# Patient Record
Sex: Female | Born: 1995 | Race: White | Hispanic: Yes | Smoking: Never smoker
Health system: Southern US, Community
[De-identification: ages and names within clinical notes are randomized; demographics above are authoritative.]

## PROBLEM LIST (undated history)

## (undated) ENCOUNTER — Inpatient Hospital Stay (HOSPITAL_COMMUNITY): Payer: Self-pay

## (undated) DIAGNOSIS — R519 Headache, unspecified: Secondary | ICD-10-CM

## (undated) DIAGNOSIS — N200 Calculus of kidney: Secondary | ICD-10-CM

## (undated) DIAGNOSIS — A159 Respiratory tuberculosis unspecified: Secondary | ICD-10-CM

## (undated) HISTORY — PX: NO PAST SURGERIES: SHX2092

## (undated) HISTORY — DX: Respiratory tuberculosis unspecified: A15.9

## (undated) HISTORY — DX: Headache, unspecified: R51.9

---
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
## 2014-01-26 ENCOUNTER — Ambulatory Visit: Payer: Self-pay | Attending: Internal Medicine

## 2014-02-07 ENCOUNTER — Encounter: Payer: Self-pay | Admitting: Internal Medicine

## 2014-02-07 ENCOUNTER — Ambulatory Visit: Payer: Self-pay | Attending: Internal Medicine | Admitting: Internal Medicine

## 2014-02-07 VITALS — BP 116/77 | HR 77 | Temp 98.8°F | Resp 16 | Ht 65.0 in | Wt 136.0 lb

## 2014-02-07 DIAGNOSIS — Z0000 Encounter for general adult medical examination without abnormal findings: Secondary | ICD-10-CM

## 2014-02-07 DIAGNOSIS — Z114 Encounter for screening for human immunodeficiency virus [HIV]: Secondary | ICD-10-CM | POA: Insufficient documentation

## 2014-02-07 DIAGNOSIS — Z113 Encounter for screening for infections with a predominantly sexual mode of transmission: Secondary | ICD-10-CM

## 2014-02-07 DIAGNOSIS — Z23 Encounter for immunization: Secondary | ICD-10-CM

## 2014-02-07 LAB — LIPID PANEL
Cholesterol: 174 mg/dL (ref 0–200)
HDL: 60 mg/dL
LDL Cholesterol: 92 mg/dL (ref 0–99)
Total CHOL/HDL Ratio: 2.9 ratio
Triglycerides: 109 mg/dL
VLDL: 22 mg/dL (ref 0–40)

## 2014-02-07 LAB — CERVICOVAGINAL ANCILLARY ONLY
Wet Prep (BD Affirm): NEGATIVE
Wet Prep (BD Affirm): POSITIVE — AB
Wet Prep (BD Affirm): POSITIVE — AB

## 2014-02-07 NOTE — Progress Notes (Signed)
Patient ID: Morgan Hartman, female   DOB: 1995-08-29, 18 y.o.   MRN: [id number]  NFA:[id number]  ION:[id number]  DOB - 1995-08-29  CC:  Chief Complaint  Patient presents with  . Establish Care       HPI: Morgan Hartman is a 18 y.o. female here today to establish medical care.  Patient has no past medical history and would like a general check up.  She is currently sexually active and is unsure if she should get STD testing.  She has never use tobacco, alcohol, or illicit drugs.  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 She has no c/o today.    Patient has No headache, No chest pain, No abdominal pain - No Nausea, No new weakness tingling or numbness, No Cough - SOB.  No Known Allergies History reviewed. No pertinent past medical history. No current outpatient prescriptions on file prior to visit.   No current facility-administered medications on file prior to visit.   Family History  Problem Relation Age of Onset  . Diabetes Maternal Grandmother   . Depression Maternal Grandfather   . Diabetes Paternal Grandmother    History   Social History  . Marital Status: Single    Spouse Name: N/A    Number of Children: N/A  . Years of Education: N/A   Occupational History  . Not on file.   Social History Main Topics  . Smoking status: Never Smoker   . Smokeless tobacco: Not on file  . Alcohol Use: No  . Drug Use: No  . Sexual Activity: Yes   Other Topics Concern  . Not on file   Social History Narrative  . No narrative on file    Review of Systems: Constitutional: Negative for fever, chills, diaphoresis, activity change, appetite change and fatigue. HENT: Negative for ear pain, nosebleeds, congestion, facial swelling, rhinorrhea, neck pain, neck stiffness and ear discharge.  Eyes: Negative for pain, discharge, redness, itching and visual disturbance. Respiratory: Negative for cough, choking, chest tightness, shortness of breath, wheezing and  stridor.  Cardiovascular: Negative for chest pain, palpitations and leg swelling. Gastrointestinal: Negative for abdominal distention. Genitourinary: Negative for dysuria, urgency, frequency, hematuria, flank pain, decreased urine volume, difficulty urinating and dyspareunia.  Musculoskeletal: Negative for back pain, joint swelling, arthralgia and gait problem. Neurological: Negative for dizziness, tremors, seizures, syncope, facial asymmetry, speech difficulty, weakness, light-headedness, numbness and headaches.  Hematological: Negative for adenopathy. Does not bruise/bleed easily. Psychiatric/Behavioral: Negative for hallucinations, behavioral problems, confusion, dysphoric mood, decreased concentration and agitation.    Objective:   Filed Vitals:   02/07/14 0913  BP: 116/77  Pulse: 77  Temp: 98.8 F (37.1 C)  Resp: 16    Physical Exam: Constitutional: Patient appears well-developed and well-nourished. No distress. HENT: Normocephalic, atraumatic, External right and left ear normal. Oropharynx is clear and moist.  Eyes: Conjunctivae and EOM are normal. PERRLA, no scleral icterus. Neck: Normal ROM. Neck supple. No JVD. No tracheal deviation. No thyromegaly. CVS: RRR, S1/S2 +, no murmurs, no gallops, no carotid bruit.  Pulmonary: Effort and breath sounds normal, no stridor, rhonchi, wheezes, rales.  Abdominal: Soft. BS +, no distension, tenderness, rebound or guarding.  Musculoskeletal: Normal range of motion. No edema and no tenderness.  Lymphadenopathy: No lymphadenopathy noted, cervical Neuro: Alert. Normal reflexes, muscle tone coordination. No cranial nerve deficit. Skin: Skin is warm and dry. No rash noted. Not diaphoretic. No erythema. No pallor. Psychiatric: Normal mood and affect. Behavior, judgment, thought content normal.  No results found  for this basename: WBC, HGB, HCT, MCV, PLT   No results found for this basename: CREATININE, BUN, NA, K, CL, CO2    No results  found for this basename: HGBA1C   Lipid Panel  No results found for this basename: chol, trig, hdl, cholhdl, vldl, ldlcalc       Assessment and plan:   Morgan Ruerlene was seen today for establish care.  Diagnoses and associated orders for this visit:  Annual physical exam - Cervicovaginal ancillary only - GC/chlamydia probe amp, urine - Comprehensive metabolic panel; Standing - Lipid panel - Vitamin D, 25-hydroxy - HIV antibody (with reflex) - RPR - CBC - Comprehensive metabolic panel - TSH  Screening for STD (sexually transmitted disease) Will call with results  Need for influenza vaccination Received     Return if symptoms worsen or fail to improve.      Holland CommonsKECK, Dehaven Sine, NP-C [facility] [phone] 02/07/2014, 9:25 AM

## 2014-02-07 NOTE — Patient Instructions (Signed)

## 2014-02-07 NOTE — Progress Notes (Signed)
Pt is here to establish care. Pt is here for a physical.

## 2014-02-08 LAB — GC/CHLAMYDIA PROBE AMP, URINE
Chlamydia, Swab/Urine, PCR: NEGATIVE
GC Probe Amp, Urine: NEGATIVE

## 2014-02-08 LAB — RPR

## 2014-02-08 LAB — HIV ANTIBODY (ROUTINE TESTING W REFLEX): HIV 1&2 Ab, 4th Generation: NONREACTIVE

## 2014-02-08 LAB — VITAMIN D 25 HYDROXY (VIT D DEFICIENCY, FRACTURES): Vit D, 25-Hydroxy: 26 ng/mL — ABNORMAL LOW (ref 30–89)

## 2014-02-12 ENCOUNTER — Telehealth: Payer: Self-pay | Admitting: *Deleted

## 2014-02-12 MED ORDER — FLUCONAZOLE 150 MG PO TABS: 150.0000 mg | ORAL_TABLET | Freq: Once | ORAL | Status: DC

## 2014-02-12 MED ORDER — METRONIDAZOLE 500 MG PO TABS: 500.0000 mg | ORAL_TABLET | Freq: Two times a day (BID) | ORAL | Status: DC

## 2014-02-12 NOTE — Telephone Encounter (Signed)
Pt aware of lab results. Rx Diflucan and Metronidazole was e-scribe to our pharmacy.

## 2014-02-12 NOTE — Telephone Encounter (Signed)
Message copied by Dyann KiefGIRALDEZ, Camani Sesay M on Mon Feb 12, 2014 12:32 PM ------      Message from: Ambrose FinlandKECK, VALERIE A      Created: Sun Feb 11, 2014  5:39 PM       Patient positive for yeast and BV. Please explain that these are not STD's. Send prescription for diflucan 150 mg PO once and metronidazole 500 mg BID for 7 days.  All STD's are negative.  She will need OTC vitamin d 1,000 IU to supplement low levels. Thanks ------

## 2015-04-28 DIAGNOSIS — N200 Calculus of kidney: Secondary | ICD-10-CM

## 2015-04-28 HISTORY — DX: Calculus of kidney: N20.0

## 2015-10-23 ENCOUNTER — Ambulatory Visit (HOSPITAL_COMMUNITY)
Admission: RE | Admit: 2015-10-23 | Discharge: 2015-10-23 | Disposition: A | Discharge status: Home / Self Care | Payer: Self-pay | Source: Ambulatory Visit | Attending: Preventative Medicine | Admitting: Preventative Medicine

## 2015-10-23 ENCOUNTER — Other Ambulatory Visit (HOSPITAL_COMMUNITY): Payer: Self-pay | Admitting: Preventative Medicine

## 2015-10-23 DIAGNOSIS — R1031 Right lower quadrant pain: Secondary | ICD-10-CM | POA: Insufficient documentation

## 2015-10-23 DIAGNOSIS — N132 Hydronephrosis with renal and ureteral calculous obstruction: Secondary | ICD-10-CM | POA: Insufficient documentation

## 2016-01-07 ENCOUNTER — Emergency Department (HOSPITAL_COMMUNITY): Payer: Medicaid Other

## 2016-01-07 ENCOUNTER — Encounter: Payer: Self-pay | Admitting: Internal Medicine

## 2016-01-07 ENCOUNTER — Emergency Department (HOSPITAL_COMMUNITY)
Admission: EM | Admit: 2016-01-07 | Discharge: 2016-01-07 | Disposition: A | Discharge status: Home / Self Care | Payer: Medicaid Other | Attending: Emergency Medicine | Admitting: Emergency Medicine

## 2016-01-07 ENCOUNTER — Encounter (HOSPITAL_COMMUNITY): Payer: Self-pay | Admitting: Emergency Medicine

## 2016-01-07 DIAGNOSIS — O209 Hemorrhage in early pregnancy, unspecified: Secondary | ICD-10-CM | POA: Diagnosis not present

## 2016-01-07 DIAGNOSIS — Z3A08 8 weeks gestation of pregnancy: Secondary | ICD-10-CM | POA: Insufficient documentation

## 2016-01-07 DIAGNOSIS — Z79899 Other long term (current) drug therapy: Secondary | ICD-10-CM | POA: Diagnosis not present

## 2016-01-07 HISTORY — DX: Calculus of kidney: N20.0

## 2016-01-07 LAB — TYPE AND SCREEN
ABO/RH(D): O POS
ANTIBODY SCREEN: NEGATIVE

## 2016-01-07 LAB — BASIC METABOLIC PANEL
ANION GAP: 6 (ref 5–15)
BUN: 10 mg/dL (ref 6–20)
CO2: 24 mmol/L (ref 22–32)
Calcium: 9.2 mg/dL (ref 8.9–10.3)
Chloride: 107 mmol/L (ref 101–111)
Creatinine, Ser: 0.45 mg/dL (ref 0.44–1.00)
GLUCOSE: 96 mg/dL (ref 65–99)
POTASSIUM: 3.7 mmol/L (ref 3.5–5.1)
SODIUM: 137 mmol/L (ref 135–145)

## 2016-01-07 LAB — HCG, QUANTITATIVE, PREGNANCY: HCG, BETA CHAIN, QUANT, S: 1162 m[IU]/mL — AB (ref <5)

## 2016-01-07 LAB — WET PREP, GENITAL
Clue Cells Wet Prep HPF POC: NONE SEEN
SPERM: NONE SEEN
Trich, Wet Prep: NONE SEEN
Yeast Wet Prep HPF POC: NONE SEEN

## 2016-01-07 LAB — CBC
HCT: 35.9 % — ABNORMAL LOW (ref 36.0–46.0)
Hemoglobin: 11.9 g/dL — ABNORMAL LOW (ref 12.0–15.0)
MCH: 29.2 pg (ref 26.0–34.0)
MCHC: 33.1 g/dL (ref 30.0–36.0)
MCV: 88.2 fL (ref 78.0–100.0)
PLATELETS: 271 10*3/uL (ref 150–400)
RBC: 4.07 MIL/uL (ref 3.87–5.11)
RDW: 13.5 % (ref 11.5–15.5)
WBC: 5.5 10*3/uL (ref 4.0–10.5)

## 2016-01-07 LAB — I-STAT BETA HCG BLOOD, ED (MC, WL, AP ONLY): I-stat hCG, quantitative: 1057.8 m[IU]/mL — ABNORMAL HIGH (ref <5)

## 2016-01-07 LAB — ABO/RH: ABO/RH(D): O POS

## 2016-01-07 NOTE — ED Notes (Signed)
Patient transported to Ultrasound 

## 2016-01-07 NOTE — ED Notes (Signed)
Patient report bleeding and she is 8 weeks preg

## 2016-01-07 NOTE — ED Provider Notes (Signed)
[facility]-EMERGENCY DEPT Provider Note   CSN: [id number] Arrival date & time: 01/07/16  0836     History   Chief Complaint Chief Complaint  Patient presents with  . Vaginal Bleeding    HPI Morgan Hartman is a 20 y.o. female presenting with vaginal bleeding. She is approximately [redacted] weeks pregnant. This is her first pregnancy. Patient states that her last ventral. Was 6/18 and she [facility]. Has not had any ultrasounds. 5 days ago developed spotting and then 2 days ago developed heavier bleeding with clots. Not as heavy as her typical periods with her very heavy. Has had some lower abdominal cramping. Does not know her blood type. No dizziness or lightheadedness. No urinary symptoms or vaginal discharge.  HPI  Past Medical History:  Diagnosis Date  . Kidney calculus 2017    There are no active problems to display for this patient.   History reviewed. No pertinent surgical history.  OB History    Gravida Para Term Preterm AB Living   1 0 0 0 0     SAB TAB Ectopic Multiple Live Births   0 0 0           Home Medications    Prior to Admission medications   Medication Sig Start Date End Date Taking? Authorizing Provider  Prenatal Vit-Fe Fumarate-FA (PRENATAL PLUS PO) Take 1 tablet by mouth daily.   Yes Historical Provider, MD  fluconazole (DIFLUCAN) 150 MG tablet Take 1 tablet (150 mg total) by mouth once. 02/12/14   Ambrose Finland, NP  metroNIDAZOLE (FLAGYL) 500 MG tablet Take 1 tablet (500 mg total) by mouth 2 (two) times daily. 02/12/14   Ambrose Finland, NP    Family History Family History  Problem Relation Age of Onset  . Diabetes Maternal Grandmother   . Depression Maternal Grandfather   . Diabetes Paternal Grandmother     Social History Social History  Substance Use Topics  . Smoking status: Never Smoker  . Smokeless tobacco: Never Used  . Alcohol use No     Allergies   Review of patient's allergies indicates no known  allergies.   Review of Systems Review of Systems  Gastrointestinal: Positive for abdominal pain. Negative for vomiting.  Genitourinary: Positive for vaginal bleeding.  Musculoskeletal: Negative for back pain.  Neurological: Negative for dizziness and light-headedness.  All other systems reviewed and are negative.    Physical Exam Updated Vital Signs BP 126/64 (BP Location: Right Arm)   Pulse 93   Temp 98.5 F (36.9 C)   Resp 16   Ht 5\' 5"  (1.651 m)   Wt 141 lb (64 kg)   LMP 10/15/2015 (Exact Date) Comment: 8 wks today  SpO2 100%   BMI 23.46 kg/m   Physical Exam  Constitutional: She is oriented to person, place, and time. She appears well-developed and well-nourished. No distress.  HENT:  Head: Normocephalic and atraumatic.  Right Ear: External ear normal.  Left Ear: External ear normal.  Nose: Nose normal.  Eyes: Right eye exhibits no discharge. Left eye exhibits no discharge.  Cardiovascular: Normal rate, regular rhythm and normal heart sounds.   Pulmonary/Chest: Effort normal and breath sounds normal.  Abdominal: Soft. There is tenderness in the suprapubic area.    Genitourinary: There is bleeding in the vagina.  Genitourinary Comments: Moderate blood in vagina. Difficult to get a good view of cervix, unclear if open or not. No heavy bleeding or obvious clots/tissue  Neurological: She is  alert and oriented to person, place, and time.  Skin: Skin is warm and dry. She is not diaphoretic.  Nursing note and vitals reviewed.    ED Treatments / Results  Labs (all labs ordered are listed, but only abnormal results are displayed) Labs Reviewed  WET PREP, GENITAL - Abnormal; Notable for the following:       Result Value   WBC, Wet Prep HPF POC FEW (*)    All other components within normal limits  CBC - Abnormal; Notable for the following:    Hemoglobin 11.9 (*)    HCT 35.9 (*)    All other components within normal limits  HCG, QUANTITATIVE, PREGNANCY - Abnormal;  Notable for the following:    hCG, Beta Chain, Quant, S 1,162 (*)    All other components within normal limits  I-STAT BETA HCG BLOOD, ED ([facility], [facility], [facility] ONLY) - Abnormal; Notable for the following:    I-stat hCG, quantitative 1,057.8 (*)    All other components within normal limits  BASIC METABOLIC PANEL  ABO/RH  TYPE AND SCREEN  GC/CHLAMYDIA PROBE AMP ([place]) NOT AT [facility]    EKG  EKG Interpretation None       Radiology US Ob Comp Less 14 Wks  Result Date: 01/07/2016 CLINICAL DATA:  Heavy vaginal bleeding for 2 days. EXAM: OBSTETRIC <14 WK [place] AND TRANSVAGINAL OB US TECHNIQUE: Both transabdominal and transvaginal ultrasound examinations were performed for complete evaluation of the gestation as well as the maternal uterus, adnexal regions, and pelvic cul-de-sac. Transvaginal technique was performed to assess early pregnancy. COMPARISON:  None. FINDINGS: Intrauterine gestational sac: Not visualized Yolk sac:  Not visualized Embryo:  Not visualized Cardiac Activity: Heart Rate:   bpm MSD:   mm    w     d CRL:    mm    w    d                  [place] EDC: Subchorionic hemorrhage:  None visualized. Maternal uterus/adnexae: 1.6 cm corpus luteum cyst on the left. Small amount of free fluid in the pelvis. IMPRESSION: No intrauterine pregnancy visualized. Differential considerations would include early intrauterine pregnancy too early to visualize, spontaneous abortion, or occult ectopic pregnancy. Recommend close clinical followup and serial quantitative beta HCGs and ultrasounds. Electronically Signed   By: Charlett Nose M.D.   On: 01/07/2016 10:53   US Ob Transvaginal  Result Date: 01/07/2016 CLINICAL DATA:  Heavy vaginal bleeding for 2 days. EXAM: OBSTETRIC <14 WK [place] AND TRANSVAGINAL OB US TECHNIQUE: Both transabdominal and transvaginal ultrasound examinations were performed for complete evaluation of the gestation as well as the maternal uterus, adnexal regions, and pelvic cul-de-sac. Transvaginal  technique was performed to assess early pregnancy. COMPARISON:  None. FINDINGS: Intrauterine gestational sac: Not visualized Yolk sac:  Not visualized Embryo:  Not visualized Cardiac Activity: Heart Rate:   bpm MSD:   mm    w     d CRL:    mm    w    d                  [place] EDC: Subchorionic hemorrhage:  None visualized. Maternal uterus/adnexae: 1.6 cm corpus luteum cyst on the left. Small amount of free fluid in the pelvis. IMPRESSION: No intrauterine pregnancy visualized. Differential considerations would include early intrauterine pregnancy too early to visualize, spontaneous abortion, or occult ectopic pregnancy. Recommend close clinical followup and serial quantitative beta HCGs and ultrasounds. Electronically  Signed   By: Charlett NoseKevin  Dover M.D.   On: 01/07/2016 10:53    Procedures Procedures (including critical care time)  Medications Ordered in ED Medications - No data to display   Initial Impression / Assessment and Plan / ED Course  I have reviewed the triage vital signs and the nursing notes.  Pertinent labs & imaging results that were available during my care of the patient were reviewed by me and considered in my medical decision making (see chart for details).  Clinical Course  Comment By Time  Plan for pelvic ultrasound, pelvic exam, labs including RH. Hemodynamically stable. Pricilla LovelessScott Deneise Getty, MD 09/12 [phone]  U/s with no obvious pregnancy. Remains stable. Unclear if this is early pregnancy, miscarriage or early ectopic. Will have her f/u with OB in 2 days for repeat HCG and u/s. Discussed strict return precautions. Pricilla LovelessScott Whitt Auletta, MD 09/12 1130    Final Clinical Impressions(s) / ED Diagnoses   Final diagnoses:  Vaginal bleeding in pregnancy, first trimester    New Prescriptions Discharge Medication List as of 01/07/2016 11:29 AM       Pricilla LovelessScott Zykeem Bauserman, MD 01/07/16 [phone]

## 2016-01-07 NOTE — Discharge Instructions (Signed)
[facility] very [facility] test of your pregnancy as well as a repeat ultrasound. If you develop any worsening pain or vaginal bleeding, return here or any other hospital for evaluation.

## 2016-01-07 NOTE — ED Triage Notes (Signed)
Pt reports spotting beginning 9/6 and increased bleeding yesterday and this morning.  Reports cramping and bright red blood.  Denies vomiting.  Reports nausea.

## 2016-01-08 LAB — GC/CHLAMYDIA PROBE AMP (~~LOC~~) NOT AT ARMC
Chlamydia: NEGATIVE
Neisseria Gonorrhea: NEGATIVE

## 2016-01-09 ENCOUNTER — Other Ambulatory Visit: Payer: Self-pay

## 2016-01-09 DIAGNOSIS — Z3490 Encounter for supervision of normal pregnancy, unspecified, unspecified trimester: Secondary | ICD-10-CM

## 2016-01-10 LAB — HCG, QUANTITATIVE, PREGNANCY: hCG, Beta Chain, Quant, S: 266.4 m[IU]/mL — ABNORMAL HIGH

## 2016-01-13 ENCOUNTER — Telehealth: Payer: Self-pay | Admitting: *Deleted

## 2016-01-13 NOTE — Telephone Encounter (Signed)
Pt left message on 9/14 @ 1114 requesting test results from the previous day.

## 2016-01-14 NOTE — Telephone Encounter (Signed)
I attempted to call patientno answer left message on voicemail.  I don't know if anyone discussed this pt w/ another provider, but it came to my box. Drop in quant C/W SAB. Needs weekly quants until <1 due to IUP not confirmed.

## 2016-01-22 NOTE — Telephone Encounter (Signed)
Letter has been sent to the patient regarding needing another appointment.

## 2016-01-23 ENCOUNTER — Other Ambulatory Visit: Payer: Self-pay

## 2016-01-23 DIAGNOSIS — O3680X0 Pregnancy with inconclusive fetal viability, not applicable or unspecified: Secondary | ICD-10-CM

## 2016-01-24 LAB — HCG, QUANTITATIVE, PREGNANCY: hCG, Beta Chain, Quant, S: <2 m[IU]/mL

## 2016-04-27 NOTE — L&D Delivery Note (Signed)
21 y.o. G2P0010 at 3452w4d delivered a viable female infant in cephalic, ROA position. Anterior shoulder delivered with ease. 60 sec delayed cord clamping. Cord clamped x2 and cut. Placenta delivered spontaneously intact, with 3VC. Fundus firm on exam with massage and pitocin. Good hemostasis noted.  Anesthesia: None Laceration: minor right labial abrasion approximates well, no sutures required  Good hemostasis noted. EBL: 150 cc  Mom and baby recovering in LDR.    Apgars: APGAR (1 MIN): 8   APGAR (5 MINS): 9    Weight: Pending skin to skin  Sponge and instrument count were correct x2. Placenta sent to L&D.  Howard PouchLauren Feng, MD PGY-2 Family Medicine 10/29/2016, 6:19 AM  Patient is a G2P0010 at 552w4d who was admitted with SROM/SOL, essentially uncomplicated prenatal course.  She progressed without augmentation to SVD.  I was gloved and present for delivery in its entirety.  Second stage of labor progressed, baby delivered after pushing approx 1 hour.  Mild decels during second stage noted.  Complications: none  Lacerations: none  EBL: 150cc  Cam HaiSHAW, Lakeisha Waldrop, CNM 9:43 AM 10/29/2016

## 2016-04-29 ENCOUNTER — Encounter: Payer: Self-pay | Admitting: Obstetrics and Gynecology

## 2016-04-29 ENCOUNTER — Ambulatory Visit (INDEPENDENT_AMBULATORY_CARE_PROVIDER_SITE_OTHER): Payer: Medicaid Other | Admitting: Obstetrics and Gynecology

## 2016-04-29 ENCOUNTER — Other Ambulatory Visit (HOSPITAL_COMMUNITY)
Admission: RE | Admit: 2016-04-29 | Discharge: 2016-04-29 | Disposition: A | Discharge status: Home / Self Care | Payer: Medicaid Other | Source: Ambulatory Visit | Attending: Obstetrics and Gynecology | Admitting: Obstetrics and Gynecology

## 2016-04-29 DIAGNOSIS — Z3401 Encounter for supervision of normal first pregnancy, first trimester: Secondary | ICD-10-CM

## 2016-04-29 DIAGNOSIS — Z23 Encounter for immunization: Secondary | ICD-10-CM

## 2016-04-29 DIAGNOSIS — Z01419 Encounter for gynecological examination (general) (routine) without abnormal findings: Secondary | ICD-10-CM | POA: Insufficient documentation

## 2016-04-29 DIAGNOSIS — Z113 Encounter for screening for infections with a predominantly sexual mode of transmission: Secondary | ICD-10-CM | POA: Diagnosis present

## 2016-04-29 DIAGNOSIS — Z3400 Encounter for supervision of normal first pregnancy, unspecified trimester: Secondary | ICD-10-CM | POA: Insufficient documentation

## 2016-04-29 NOTE — Progress Notes (Signed)
  Subjective:    Morgan Hartman is a G2P0010 6183w3d being seen today for her first obstetrical visit.  Her obstetrical history is significant for first normal pregnancy. Patient does intend to breast feed. Pregnancy history fully reviewed.  Patient reports no complaints.  There were no vitals filed for this visit.  HISTORY: OB History  Gravida Para Term Preterm AB Living  2 0 0 0 1    SAB TAB Ectopic Multiple Live Births  1 0 0        # Outcome Date GA Lbr Len/2nd Weight Sex Delivery Anes PTL Lv  2 Current           1 SAB 12/2015 6043w0d            Past Medical History:  Diagnosis Date  . Kidney calculus 2017   No past surgical history on file. Family History  Problem Relation Age of Onset  . Diabetes Maternal Grandmother   . Depression Maternal Grandfather   . Diabetes Paternal Grandmother      Exam    Uterus:   12-weeks in size  Pelvic Exam:    Perineum: No Hemorrhoids, Normal Perineum   Vulva: normal   Vagina:  normal mucosa, normal discharge   pH:    Cervix: nulliparous appearance   Adnexa: normal adnexa and no mass, fullness, tenderness   Bony Pelvis: gynecoid  System: Breast:  normal appearance, no masses or tenderness   Skin: normal coloration and turgor, no rashes    Neurologic: oriented, no focal deficits   Extremities: normal strength, tone, and muscle mass   HEENT extra ocular movement intact   Mouth/Teeth mucous membranes moist, pharynx normal without lesions and dental hygiene good   Neck supple and no masses   Cardiovascular: regular rate and rhythm   Respiratory:  chest clear, no wheezing, crepitations, rhonchi, normal symmetric air entry   Abdomen: soft, non-tender; bowel sounds normal; no masses,  no organomegaly   Urinary:       Assessment:    Pregnancy: G2P0010 There are no active problems to display for this patient.       Plan:     Initial labs drawn. Prenatal vitamins. Problem list reviewed and updated. Genetic Screening  discussed Quad Screen: requested.  Ultrasound discussed; fetal survey: requested. Patient desires flu vaccine  Follow up in 4 weeks. 50% of 30 min visit spent on counseling and coordination of care.     Annmarie Plemmons 04/29/2016

## 2016-04-29 NOTE — Patient Instructions (Signed)
Second Trimester of Pregnancy The second trimester is from week 13 through week 28 (months 4 through 6). The second trimester is often a time when you feel your best. Your body has also adjusted to being pregnant, and you begin to feel better physically. Usually, morning sickness has lessened or quit completely, you may have more energy, and you may have an increase in appetite. The second trimester is also a time when the fetus is growing rapidly. At the end of the sixth month, the fetus is about 9 inches long and weighs about 1 pounds. You will likely begin to feel the baby move (quickening) between 18 and 20 weeks of the pregnancy. Body changes during your second trimester Your body continues to go through many changes during your second trimester. The changes vary from woman to woman.  Your weight will continue to increase. You will notice your lower abdomen bulging out.  You may begin to get stretch marks on your hips, abdomen, and breasts.  You may develop headaches that can be relieved by medicines. The medicines should be approved by your health care provider.  You may urinate more often because the fetus is pressing on your bladder.  You may develop or continue to have heartburn as a result of your pregnancy.  You may develop constipation because certain hormones are causing the muscles that push waste through your intestines to slow down.  You may develop hemorrhoids or swollen, bulging veins (varicose veins).  You may have back pain. This is caused by:  Weight gain.  Pregnancy hormones that are relaxing the joints in your pelvis.  A shift in weight and the muscles that support your balance.  Your breasts will continue to grow and they will continue to become tender.  Your gums may bleed and may be sensitive to brushing and flossing.  Dark spots or blotches (chloasma, mask of pregnancy) may develop on your face. This will likely fade after the baby is born.  A dark line  from your belly button to the pubic area (linea nigra) may appear. This will likely fade after the baby is born.  You may have changes in your hair. These can include thickening of your hair, rapid growth, and changes in texture. Some women also have hair loss during or after pregnancy, or hair that feels dry or thin. Your hair will most likely return to normal after your baby is born. What to expect at prenatal visits During a routine prenatal visit:  You will be weighed to make sure you and the fetus are growing normally.  Your blood pressure will be taken.  Your abdomen will be measured to track your baby's growth.  The fetal heartbeat will be listened to.  Any test results from the previous visit will be discussed. Your health care provider may ask you:  How you are feeling.  If you are feeling the baby move.  If you have had any abnormal symptoms, such as leaking fluid, bleeding, severe headaches, or abdominal cramping.  If you are using any tobacco products, including cigarettes, chewing tobacco, and electronic cigarettes.  If you have any questions. Other tests that may be performed during your second trimester include:  Blood tests that check for:  Low iron levels (anemia).  Gestational diabetes (between 24 and 28 weeks).  Rh antibodies. This is to check for a protein on red blood cells (Rh factor).  Urine tests to check for infections, diabetes, or protein in the urine.  An ultrasound to   confirm the proper growth and development of the baby.  An amniocentesis to check for possible genetic problems.  Fetal screens for spina bifida and Down syndrome.  HIV (human immunodeficiency virus) testing. Routine prenatal testing includes screening for HIV, unless you choose not to have this test. Follow these instructions at home: Eating and drinking  Continue to eat regular, healthy meals.  Avoid raw meat, uncooked cheese, cat litter boxes, and soil used by cats. These  carry germs that can cause birth defects in the baby.  Take your prenatal vitamins.  Take 1500-2000 mg of calcium daily starting at the 20th week of pregnancy until you deliver your baby.  If you develop constipation:  Take over-the-counter or prescription medicines.  Drink enough fluid to keep your urine clear or pale yellow.  Eat foods that are high in fiber, such as fresh fruits and vegetables, whole grains, and beans.  Limit foods that are high in fat and processed sugars, such as fried and sweet foods. Activity  Exercise only as directed by your health care provider. Experiencing uterine cramps is a good sign to stop exercising.  Avoid heavy lifting, wear low heel shoes, and practice good posture.  Wear your seat belt at all times when driving.  Rest with your legs elevated if you have leg cramps or low back pain.  Wear a good support bra for breast tenderness.  Do not use hot tubs, steam rooms, or saunas. Lifestyle  Avoid all smoking, herbs, alcohol, and unprescribed drugs. These chemicals affect the formation and growth of the baby.  Do not use any products that contain nicotine or tobacco, such as cigarettes and e-cigarettes. If you need help quitting, ask your health care provider.  A sexual relationship may be continued unless your health care provider directs you otherwise. General instructions  Follow your health care provider's instructions regarding medicine use. There are medicines that are either safe or unsafe to take during pregnancy.  Take warm sitz baths to soothe any pain or discomfort caused by hemorrhoids. Use hemorrhoid cream if your health care provider approves.  If you develop varicose veins, wear support hose. Elevate your feet for 15 minutes, 3-4 times a day. Limit salt in your diet.  Visit your dentist if you have not gone yet during your pregnancy. Use a soft toothbrush to brush your teeth and be gentle when you floss.  Keep all follow-up  prenatal visits as told by your health care provider. This is important. Contact a health care provider if:  You have dizziness.  You have mild pelvic cramps, pelvic pressure, or nagging pain in the abdominal area.  You have persistent nausea, vomiting, or diarrhea.  You have a bad smelling vaginal discharge.  You have pain with urination. Get help right away if:  You have a fever.  You are leaking fluid from your vagina.  You have spotting or bleeding from your vagina.  You have severe abdominal cramping or pain.  You have rapid weight gain or weight loss.  You have shortness of breath with chest pain.  You notice sudden or extreme swelling of your face, hands, ankles, feet, or legs.  You have not felt your baby move in over an hour.  You have severe headaches that do not go away with medicine.  You have vision changes. Summary  The second trimester is from week 13 through week 28 (months 4 through 6). It is also a time when the fetus is growing rapidly.  Your body goes   through many changes during pregnancy. The changes vary from woman to woman.  Avoid all smoking, herbs, alcohol, and unprescribed drugs. These chemicals affect the formation and growth your baby.  Do not use any tobacco products, such as cigarettes, chewing tobacco, and e-cigarettes. If you need help quitting, ask your health care provider.  Contact your health care provider if you have any questions. Keep all prenatal visits as told by your health care provider. This is important. This information is not intended to replace advice given to you by your health care provider. Make sure you discuss any questions you have with your health care provider. Document Released: 04/07/2001 Document Revised: 09/19/2015 Document Reviewed: 06/14/2012 Elsevier Interactive Patient Education  2017 [employer]  Contraception Choices Contraception (birth control) is the use of any methods or devices to prevent  pregnancy. Below are some methods to help avoid pregnancy. Hormonal methods  Contraceptive implant. This is a thin, plastic tube containing progesterone hormone. It does not contain estrogen hormone. Your health care provider inserts the tube in the inner part of the upper arm. The tube can remain in place for up to 3 years. After 3 years, the implant must be removed. The implant prevents the ovaries from releasing an egg (ovulation), thickens the cervical mucus to prevent sperm from entering the uterus, and thins the lining of the inside of the uterus.  Progesterone-only injections. These injections are given every 3 months by your health care provider to prevent pregnancy. This synthetic progesterone hormone stops the ovaries from releasing eggs. It also thickens cervical mucus and changes the uterine lining. This makes it harder for sperm to survive in the uterus.  Birth control pills. These pills contain estrogen and progesterone hormone. They work by preventing the ovaries from releasing eggs (ovulation). They also cause the cervical mucus to thicken, preventing the sperm from entering the uterus. Birth control pills are prescribed by a health care provider.Birth control pills can also be used to treat heavy periods.  Minipill. This type of birth control pill contains only the progesterone hormone. They are taken every day of each month and must be prescribed by your health care provider.  Birth control patch. The patch contains hormones similar to those in birth control pills. It must be changed once a week and is prescribed by a health care provider.  Vaginal ring. The ring contains hormones similar to those in birth control pills. It is left in the vagina for 3 weeks, removed for 1 week, and then a new one is put back in place. The patient must be comfortable inserting and removing the ring from the vagina.A health care provider's prescription is necessary.  Emergency contraception.  Emergency contraceptives prevent pregnancy after unprotected sexual intercourse. This pill can be taken right after sex or up to 5 days after unprotected sex. It is most effective the sooner you take the pills after having sexual intercourse. Most emergency contraceptive pills are available without a prescription. Check with your pharmacist. Do not use emergency contraception as your only form of birth control. Barrier methods  Female condom. This is a thin sheath (latex or rubber) that is worn over the penis during sexual intercourse. It can be used with spermicide to increase effectiveness.  Female condom. This is a soft, loose-fitting sheath that is put into the vagina before sexual intercourse.  Diaphragm. This is a soft, latex, dome-shaped barrier that must be fitted by a health care provider. It is inserted into the vagina, along with a   spermicidal jelly. It is inserted before intercourse. The diaphragm should be left in the vagina for 6 to 8 hours after intercourse.  Cervical cap. This is a round, soft, latex or plastic cup that fits over the cervix and must be fitted by a health care provider. The cap can be left in place for up to 48 hours after intercourse.  Sponge. This is a soft, circular piece of polyurethane foam. The sponge has spermicide in it. It is inserted into the vagina after wetting it and before sexual intercourse.  Spermicides. These are chemicals that kill or block sperm from entering the cervix and uterus. They come in the form of creams, jellies, suppositories, foam, or tablets. They do not require a prescription. They are inserted into the vagina with an applicator before having sexual intercourse. The process must be repeated every time you have sexual intercourse. Intrauterine contraception  Intrauterine device (IUD). This is a T-shaped device that is put in a woman's uterus during a menstrual period to prevent pregnancy. There are 2 types:  Copper IUD. This type of IUD  is wrapped in copper wire and is placed inside the uterus. Copper makes the uterus and fallopian tubes produce a fluid that kills sperm. It can stay in place for 10 years.  Hormone IUD. This type of IUD contains the hormone progestin (synthetic progesterone). The hormone thickens the cervical mucus and prevents sperm from entering the uterus, and it also thins the uterine lining to prevent implantation of a fertilized egg. The hormone can weaken or kill the sperm that get into the uterus. It can stay in place for 3-5 years, depending on which type of IUD is used. Permanent methods of contraception  Female tubal ligation. This is when the woman's fallopian tubes are surgically sealed, tied, or blocked to prevent the egg from traveling to the uterus.  Hysteroscopic sterilization. This involves placing a small coil or insert into each fallopian tube. Your doctor uses a technique called hysteroscopy to do the procedure. The device causes scar tissue to form. This results in permanent blockage of the fallopian tubes, so the sperm cannot fertilize the egg. It takes about 3 months after the procedure for the tubes to become blocked. You must use another form of birth control for these 3 months.  Female sterilization. This is when the female has the tubes that carry sperm tied off (vasectomy).This blocks sperm from entering the vagina during sexual intercourse. After the procedure, the man can still ejaculate fluid (semen). Natural planning methods  Natural family planning. This is not having sexual intercourse or using a barrier method (condom, diaphragm, cervical cap) on days the woman could become pregnant.  Calendar method. This is keeping track of the length of each menstrual cycle and identifying when you are fertile.  Ovulation method. This is avoiding sexual intercourse during ovulation.  Symptothermal method. This is avoiding sexual intercourse during ovulation, using a thermometer and ovulation  symptoms.  Post-ovulation method. This is timing sexual intercourse after you have ovulated. Regardless of which type or method of contraception you choose, it is important that you use condoms to protect against the transmission of sexually transmitted infections (STIs). Talk with your health care provider about which form of contraception is most appropriate for you. This information is not intended to replace advice given to you by your health care provider. Make sure you discuss any questions you have with your health care provider. Document Released: 04/13/2005 Document Revised: 09/19/2015 Document Reviewed: 10/06/2012 Elsevier Interactive   Patient Education  2017 [employer]   Breastfeeding Deciding to breastfeed is one of the best choices you can make for you and your baby. A change in hormones during pregnancy causes your breast tissue to grow and increases the number and size of your milk ducts. These hormones also allow proteins, sugars, and fats from your blood supply to make breast milk in your milk-producing glands. Hormones prevent breast milk from being released before your baby is born as well as prompt milk flow after birth. Once breastfeeding has begun, thoughts of your baby, as well as his or her sucking or crying, can stimulate the release of milk from your milk-producing glands. Benefits of breastfeeding For Your Baby  Your first milk (colostrum) helps your baby's digestive system function better.  There are antibodies in your milk that help your baby fight off infections.  Your baby has a lower incidence of asthma, allergies, and sudden infant death syndrome.  The nutrients in breast milk are better for your baby than infant formulas and are designed uniquely for your baby's needs.  Breast milk improves your baby's brain development.  Your baby is less likely to develop other conditions, such as childhood obesity, asthma, or type 2 diabetes mellitus. For  You  Breastfeeding helps to create a very special bond between you and your baby.  Breastfeeding is convenient. Breast milk is always available at the correct temperature and costs nothing.  Breastfeeding helps to burn calories and helps you lose the weight gained during pregnancy.  Breastfeeding makes your uterus contract to its prepregnancy size faster and slows bleeding (lochia) after you give birth.  Breastfeeding helps to lower your risk of developing type 2 diabetes mellitus, osteoporosis, and breast or ovarian cancer later in life. Signs that your baby is hungry Early Signs of Hunger  Increased alertness or activity.  Stretching.  Movement of the head from side to side.  Movement of the head and opening of the mouth when the corner of the mouth or cheek is stroked (rooting).  Increased sucking sounds, smacking lips, cooing, sighing, or squeaking.  Hand-to-mouth movements.  Increased sucking of fingers or hands. Late Signs of Hunger  Fussing.  Intermittent crying. Extreme Signs of Hunger  Signs of extreme hunger will require calming and consoling before your baby will be able to breastfeed successfully. Do not wait for the following signs of extreme hunger to occur before you initiate breastfeeding:  Restlessness.  A loud, strong cry.  Screaming. Breastfeeding basics  Breastfeeding Initiation  Find a comfortable place to sit or lie down, with your neck and back well supported.  Place a pillow or rolled up blanket under your baby to bring him or her to the level of your breast (if you are seated). Nursing pillows are specially designed to help support your arms and your baby while you breastfeed.  Make sure that your baby's abdomen is facing your abdomen.  Gently massage your breast. With your fingertips, massage from your chest wall toward your nipple in a circular motion. This encourages milk flow. You may need to continue this action during the feeding if your  milk flows slowly.  Support your breast with 4 fingers underneath and your thumb above your nipple. Make sure your fingers are well away from your nipple and your baby's mouth.  Stroke your baby's lips gently with your finger or nipple.  When your baby's mouth is open wide enough, quickly bring your baby to your breast, placing your entire nipple and   as much of the colored area around your nipple (areola) as possible into your baby's mouth.  More areola should be visible above your baby's upper lip than below the lower lip.  Your baby's tongue should be between his or her lower gum and your breast.  Ensure that your baby's mouth is correctly positioned around your nipple (latched). Your baby's lips should create a seal on your breast and be turned out (everted).  It is common for your baby to suck about 2-3 minutes in order to start the flow of breast milk. Latching  Teaching your baby how to latch on to your breast properly is very important. An improper latch can cause nipple pain and decreased milk supply for you and poor weight gain in your baby. Also, if your baby is not latched onto your nipple properly, he or she may swallow some air during feeding. This can make your baby fussy. Burping your baby when you switch breasts during the feeding can help to get rid of the air. However, teaching your baby to latch on properly is still the best way to prevent fussiness from swallowing air while breastfeeding. Signs that your baby has successfully latched on to your nipple:  Silent tugging or silent sucking, without causing you pain.  Swallowing heard between every 3-4 sucks.  Muscle movement above and in front of his or her ears while sucking. Signs that your baby has not successfully latched on to nipple:  Sucking sounds or smacking sounds from your baby while breastfeeding.  Nipple pain. If you think your baby has not latched on correctly, slip your finger into the corner of your baby's  mouth to break the suction and place it between your baby's gums. Attempt breastfeeding initiation again. Signs of Successful Breastfeeding  Signs from your baby:  A gradual decrease in the number of sucks or complete cessation of sucking.  Falling asleep.  Relaxation of his or her body.  Retention of a small amount of milk in his or her mouth.  Letting go of your breast by himself or herself. Signs from you:  Breasts that have increased in firmness, weight, and size 1-3 hours after feeding.  Breasts that are softer immediately after breastfeeding.  Increased milk volume, as well as a change in milk consistency and color by the fifth day of breastfeeding.  Nipples that are not sore, cracked, or bleeding. Signs That Your Baby is Getting Enough Milk  Wetting at least 1-2 diapers during the first 24 hours after birth.  Wetting at least 5-6 diapers every 24 hours for the first week after birth. The urine should be clear or pale yellow by 5 days after birth.  Wetting 6-8 diapers every 24 hours as your baby continues to grow and develop.  At least 3 stools in a 24-hour period by age 5 days. The stool should be soft and yellow.  At least 3 stools in a 24-hour period by age 7 days. The stool should be seedy and yellow.  No loss of weight greater than 10% of birth weight during the first 3 days of age.  Average weight gain of 4-7 ounces (113-198 g) per week after age 4 days.  Consistent daily weight gain by age 5 days, without weight loss after the age of 2 weeks. After a feeding, your baby may spit up a small amount. This is common. Breastfeeding frequency and duration Frequent feeding will help you make more milk and can prevent sore nipples and breast engorgement. Breastfeed   when you feel the need to reduce the fullness of your breasts or when your baby shows signs of hunger. This is called "breastfeeding on demand." Avoid introducing a pacifier to your baby while you are working  to establish breastfeeding (the first 4-6 weeks after your baby is born). After this time you may choose to use a pacifier. Research has shown that pacifier use during the first year of a baby's life decreases the risk of sudden infant death syndrome (SIDS). Allow your baby to feed on each breast as long as he or she wants. Breastfeed until your baby is finished feeding. When your baby unlatches or falls asleep while feeding from the first breast, offer the second breast. Because newborns are often sleepy in the first few weeks of life, you may need to awaken your baby to get him or her to feed. Breastfeeding times will vary from baby to baby. However, the following rules can serve as a guide to help you ensure that your baby is properly fed:  Newborns (babies 4 weeks of age or younger) may breastfeed every 1-3 hours.  Newborns should not go longer than 3 hours during the day or 5 hours during the night without breastfeeding.  You should breastfeed your baby a minimum of 8 times in a 24-hour period until you begin to introduce solid foods to your baby at around 6 months of age. Breast milk pumping Pumping and storing breast milk allows you to ensure that your baby is exclusively fed your breast milk, even at times when you are unable to breastfeed. This is especially important if you are going back to work while you are still breastfeeding or when you are not able to be present during feedings. Your lactation consultant can give you guidelines on how long it is safe to store breast milk. A breast pump is a machine that allows you to pump milk from your breast into a sterile bottle. The pumped breast milk can then be stored in a refrigerator or freezer. Some breast pumps are operated by hand, while others use electricity. Ask your lactation consultant which type will work best for you. Breast pumps can be purchased, but some hospitals and breastfeeding support groups lease breast pumps on a monthly basis.  A lactation consultant can teach you how to hand express breast milk, if you prefer not to use a pump. Caring for your breasts while you breastfeed Nipples can become dry, cracked, and sore while breastfeeding. The following recommendations can help keep your breasts moisturized and healthy:  Avoid using soap on your nipples.  Wear a supportive bra. Although not required, special nursing bras and tank tops are designed to allow access to your breasts for breastfeeding without taking off your entire bra or top. Avoid wearing underwire-style bras or extremely tight bras.  Air dry your nipples for 3-4minutes after each feeding.  Use only cotton bra pads to absorb leaked breast milk. Leaking of breast milk between feedings is normal.  Use lanolin on your nipples after breastfeeding. Lanolin helps to maintain your skin's normal moisture barrier. If you use pure lanolin, you do not need to wash it off before feeding your baby again. Pure lanolin is not toxic to your baby. You may also hand express a few drops of breast milk and gently massage that milk into your nipples and allow the milk to air dry. In the first few weeks after giving birth, some women experience extremely full breasts (engorgement). Engorgement can make your breasts   feel heavy, warm, and tender to the touch. Engorgement peaks within 3-5 days after you give birth. The following recommendations can help ease engorgement:  Completely empty your breasts while breastfeeding or pumping. You may want to start by applying warm, moist heat (in the shower or with warm water-soaked hand towels) just before feeding or pumping. This increases circulation and helps the milk flow. If your baby does not completely empty your breasts while breastfeeding, pump any extra milk after he or she is finished.  Wear a snug bra (nursing or regular) or tank top for 1-2 days to signal your body to slightly decrease milk production.  Apply ice packs to your  breasts, unless this is too uncomfortable for you.  Make sure that your baby is latched on and positioned properly while breastfeeding. If engorgement persists after 48 hours of following these recommendations, contact your health care provider or a lactation consultant. Overall health care recommendations while breastfeeding  Eat healthy foods. Alternate between meals and snacks, eating 3 of each per day. Because what you eat affects your breast milk, some of the foods may make your baby more irritable than usual. Avoid eating these foods if you are sure that they are negatively affecting your baby.  Drink milk, fruit juice, and water to satisfy your thirst (about 10 glasses a day).  Rest often, relax, and continue to take your prenatal vitamins to prevent fatigue, stress, and anemia.  Continue breast self-awareness checks.  Avoid chewing and smoking tobacco. Chemicals from cigarettes that pass into breast milk and exposure to secondhand smoke may harm your baby.  Avoid alcohol and drug use, including marijuana. Some medicines that may be harmful to your baby can pass through breast milk. It is important to ask your health care provider before taking any medicine, including all over-the-counter and prescription medicine as well as vitamin and herbal supplements. It is possible to become pregnant while breastfeeding. If birth control is desired, ask your health care provider about options that will be safe for your baby. Contact a health care provider if:  You feel like you want to stop breastfeeding or have become frustrated with breastfeeding.  You have painful breasts or nipples.  Your nipples are cracked or bleeding.  Your breasts are red, tender, or warm.  You have a swollen area on either breast.  You have a fever or chills.  You have nausea or vomiting.  You have drainage other than breast milk from your nipples.  Your breasts do not become full before feedings by the  fifth day after you give birth.  You feel sad and depressed.  Your baby is too sleepy to eat well.  Your baby is having trouble sleeping.  Your baby is wetting less than 3 diapers in a 24-hour period.  Your baby has less than 3 stools in a 24-hour period.  Your baby's skin or the white part of his or her eyes becomes yellow.  Your baby is not gaining weight by 5 days of age. Get help right away if:  Your baby is overly tired (lethargic) and does not want to wake up and feed.  Your baby develops an unexplained fever. This information is not intended to replace advice given to you by your health care provider. Make sure you discuss any questions you have with your health care provider. Document Released: 04/13/2005 Document Revised: 09/25/2015 Document Reviewed: 10/05/2012 Elsevier Interactive Patient Education  2017 Elsevier Inc.  

## 2016-04-29 NOTE — Progress Notes (Signed)
Patient states that she feels good today. 

## 2016-04-30 ENCOUNTER — Encounter: Payer: Self-pay | Admitting: Obstetrics and Gynecology

## 2016-04-30 DIAGNOSIS — Z3490 Encounter for supervision of normal pregnancy, unspecified, unspecified trimester: Secondary | ICD-10-CM

## 2016-04-30 DIAGNOSIS — O09899 Supervision of other high risk pregnancies, unspecified trimester: Secondary | ICD-10-CM

## 2016-04-30 DIAGNOSIS — Z283 Underimmunization status: Secondary | ICD-10-CM

## 2016-04-30 DIAGNOSIS — Z2839 Other underimmunization status: Secondary | ICD-10-CM

## 2016-04-30 HISTORY — DX: Other underimmunization status: Z28.39

## 2016-04-30 HISTORY — DX: Encounter for supervision of normal pregnancy, unspecified, unspecified trimester: Z34.90

## 2016-04-30 HISTORY — DX: Supervision of other high risk pregnancies, unspecified trimester: O09.899

## 2016-04-30 LAB — OBSTETRIC PANEL, INCLUDING HIV
ANTIBODY SCREEN: NEGATIVE
BASOS: 0 %
Basophils Absolute: 0 10*3/uL (ref 0.0–0.2)
EOS (ABSOLUTE): 0.1 10*3/uL (ref 0.0–0.4)
Eos: 1 %
HEMATOCRIT: 34.3 % (ref 34.0–46.6)
HIV SCREEN 4TH GENERATION: NONREACTIVE
Hemoglobin: 11.6 g/dL (ref 11.1–15.9)
Hepatitis B Surface Ag: NEGATIVE
Immature Grans (Abs): 0 10*3/uL (ref 0.0–0.1)
Immature Granulocytes: 0 %
Lymphocytes Absolute: 1.3 10*3/uL (ref 0.7–3.1)
Lymphs: 21 %
MCH: 29.3 pg (ref 26.6–33.0)
MCHC: 33.8 g/dL (ref 31.5–35.7)
MCV: 87 fL (ref 79–97)
MONOCYTES: 6 %
MONOS ABS: 0.3 10*3/uL (ref 0.1–0.9)
NEUTROS ABS: 4.3 10*3/uL (ref 1.4–7.0)
Neutrophils: 72 %
Platelets: 270 10*3/uL (ref 150–379)
RBC: 3.96 x10E6/uL (ref 3.77–5.28)
RDW: 13.8 % (ref 12.3–15.4)
RPR Ser Ql: NONREACTIVE
RUBELLA: 2.5 {index} (ref >0.99)
Rh Factor: POSITIVE
WBC: 5.9 10*3/uL (ref 3.4–10.8)

## 2016-04-30 LAB — HEMOGLOBINOPATHY EVALUATION
HGB C: 0 %
HGB S: 0 %
Hemoglobin A2 Quantitation: 2.9 % (ref 1.8–3.2)
Hemoglobin F Quantitation: 0 % (ref 0.0–2.0)
Hgb A: 97.1 % (ref 96.4–98.8)

## 2016-04-30 LAB — VARICELLA ZOSTER ANTIBODY, IGG: Varicella zoster IgG: <135 index — ABNORMAL LOW (ref >165)

## 2016-04-30 LAB — GC/CHLAMYDIA PROBE AMP (~~LOC~~) NOT AT ARMC
CHLAMYDIA, DNA PROBE: NEGATIVE
Neisseria Gonorrhea: NEGATIVE

## 2016-05-01 LAB — CYTOLOGY - PAP: DIAGNOSIS: NEGATIVE

## 2016-05-04 ENCOUNTER — Encounter: Payer: Self-pay | Admitting: Obstetrics and Gynecology

## 2016-05-04 DIAGNOSIS — R8271 Bacteriuria: Secondary | ICD-10-CM | POA: Insufficient documentation

## 2016-05-04 LAB — CULTURE, OB URINE

## 2016-05-04 LAB — URINE CULTURE, OB REFLEX

## 2016-05-05 LAB — TOXASSURE SELECT 13 (MW), URINE

## 2016-05-27 ENCOUNTER — Ambulatory Visit (INDEPENDENT_AMBULATORY_CARE_PROVIDER_SITE_OTHER): Payer: Medicaid Other | Admitting: Obstetrics and Gynecology

## 2016-05-27 VITALS — BP 114/78 | HR 82 | Temp 97.6°F | Wt 131.6 lb

## 2016-05-27 DIAGNOSIS — Z283 Underimmunization status: Secondary | ICD-10-CM

## 2016-05-27 DIAGNOSIS — Z3402 Encounter for supervision of normal first pregnancy, second trimester: Secondary | ICD-10-CM

## 2016-05-27 DIAGNOSIS — O09899 Supervision of other high risk pregnancies, unspecified trimester: Secondary | ICD-10-CM

## 2016-05-27 DIAGNOSIS — R8271 Bacteriuria: Secondary | ICD-10-CM

## 2016-05-27 DIAGNOSIS — Z3400 Encounter for supervision of normal first pregnancy, unspecified trimester: Secondary | ICD-10-CM

## 2016-05-27 NOTE — Progress Notes (Signed)
   PRENATAL VISIT NOTE  Subjective:  Morgan Hartman is a 21 y.o. G2P0010 at 7775w3d being seen today for ongoing prenatal care.  She is currently monitored for the following issues for this low-risk pregnancy and has Supervision of normal first pregnancy, antepartum; Susceptible to varicella (non-immune), currently pregnant; and GBS bacteriuria on her problem list.  Patient reports no complaints. She reports improvement in her nausea and the return of her appetite  Contractions: Not present. Vag. Bleeding: None.  Movement: Absent. Denies leaking of fluid.   The following portions of the patient's history were reviewed and updated as appropriate: allergies, current medications, past family history, past medical history, past social history, past surgical history and problem list. Problem list updated.  Objective:   Vitals:   05/27/16 1515  BP: 114/78  Pulse: 82  Temp: 97.6 F (36.4 C)  Weight: 131 lb 9.6 oz (59.7 kg)    Fetal Status: Fetal Heart Rate (bpm): 156   Movement: Absent     General:  Alert, oriented and cooperative. Patient is in no acute distress.  Skin: Skin is warm and dry. No rash noted.   Cardiovascular: Normal heart rate noted  Respiratory: Normal respiratory effort, no problems with respiration noted  Abdomen: Soft, gravid, appropriate for gestational age. Pain/Pressure: Present     Pelvic:  Cervical exam deferred        Extremities: Normal range of motion.  Edema: Trace  Mental Status: Normal mood and affect. Normal behavior. Normal judgment and thought content.   Assessment and Plan:  Pregnancy: G2P0010 at 7075w3d  1. Supervision of normal first pregnancy, antepartum Patient is doing well without complaints Quad screen today Anatomy ultrasound ordered - AFP, Quad Screen - US MFM OB COMP + 14 WK; Future  2. GBS bacteriuria Will offer prophylaxis in labor  3. Susceptible to varicella (non-immune), currently pregnant Will offer pp  General obstetric precautions  including but not limited to vaginal bleeding, contractions, leaking of fluid and fetal movement were reviewed in detail with the patient. Please refer to After Visit Summary for other counseling recommendations.  Return in about 4 weeks (around 06/24/2016) for ROB.   Catalina AntiguaPeggy Krysta Bloomfield, MD

## 2016-06-03 LAB — AFP, QUAD SCREEN
DIA Mom Value: 0.84
DIA Value (EIA): 162.69 pg/mL
DSR (BY AGE) 1 IN: 1121
DSR (Second Trimester) 1 IN: 5725
Gestational Age: 16.4 WEEKS
MSAFP MOM: 0.69
MSAFP: 26.1 ng/mL
MSHCG MOM: 0.74
MSHCG: 29329 m[IU]/mL
Maternal Age At EDD: 22.3 YEARS
Osb Risk: 10000
T18 (By Age): 1:4368 {titer}
TEST RESULTS AFP: NEGATIVE
UE3 VALUE: 0.67 ng/mL
Weight: 131 [lb_av]
uE3 Mom: 0.71

## 2016-06-15 ENCOUNTER — Ambulatory Visit (HOSPITAL_COMMUNITY)
Admission: RE | Admit: 2016-06-15 | Discharge: 2016-06-15 | Disposition: A | Discharge status: Home / Self Care | Payer: Medicaid Other | Source: Ambulatory Visit | Attending: Obstetrics and Gynecology | Admitting: Obstetrics and Gynecology

## 2016-06-15 ENCOUNTER — Other Ambulatory Visit: Payer: Self-pay | Admitting: Obstetrics and Gynecology

## 2016-06-15 DIAGNOSIS — Z3689 Encounter for other specified antenatal screening: Secondary | ICD-10-CM

## 2016-06-15 DIAGNOSIS — Z3A19 19 weeks gestation of pregnancy: Secondary | ICD-10-CM | POA: Diagnosis not present

## 2016-06-15 DIAGNOSIS — Z3400 Encounter for supervision of normal first pregnancy, unspecified trimester: Secondary | ICD-10-CM

## 2016-06-24 ENCOUNTER — Ambulatory Visit (INDEPENDENT_AMBULATORY_CARE_PROVIDER_SITE_OTHER): Payer: Medicaid Other | Admitting: Certified Nurse Midwife

## 2016-06-24 VITALS — BP 107/69 | HR 72 | Wt 138.0 lb

## 2016-06-24 DIAGNOSIS — Z2839 Other underimmunization status: Secondary | ICD-10-CM

## 2016-06-24 DIAGNOSIS — O09899 Supervision of other high risk pregnancies, unspecified trimester: Secondary | ICD-10-CM

## 2016-06-24 DIAGNOSIS — Z3400 Encounter for supervision of normal first pregnancy, unspecified trimester: Secondary | ICD-10-CM

## 2016-06-24 DIAGNOSIS — R8271 Bacteriuria: Secondary | ICD-10-CM

## 2016-06-24 DIAGNOSIS — Z3402 Encounter for supervision of normal first pregnancy, second trimester: Secondary | ICD-10-CM

## 2016-06-24 DIAGNOSIS — Z283 Underimmunization status: Secondary | ICD-10-CM

## 2016-06-24 NOTE — Progress Notes (Signed)
   PRENATAL VISIT NOTE  Subjective:  Morgan Hartman is a 21 y.o. G2P0010 at 7563w3d being seen today for ongoing prenatal care.  She is currently monitored for the following issues for this low-risk pregnancy and has Supervision of normal first pregnancy, antepartum; Susceptible to varicella (non-immune), currently pregnant; and GBS bacteriuria on her problem list.  Patient reports no complaints.  Contractions: Not present.  .  Movement: Absent. Denies leaking of fluid.   The following portions of the patient's history were reviewed and updated as appropriate: allergies, current medications, past family history, past medical history, past social history, past surgical history and problem list. Problem list updated.  Objective:   Vitals:   06/24/16 1555  BP: 107/69  Pulse: 72  Weight: 138 lb (62.6 kg)    Fetal Status: Fetal Heart Rate (bpm): 155 Fundal Height: 20 cm Movement: Absent     General:  Alert, oriented and cooperative. Patient is in no acute distress.  Skin: Skin is warm and dry. No rash noted.   Cardiovascular: Normal heart rate noted  Respiratory: Normal respiratory effort, no problems with respiration noted  Abdomen: Soft, gravid, appropriate for gestational age. Pain/Pressure: Present     Pelvic:  Cervical exam deferred        Extremities: Normal range of motion.     Mental Status: Normal mood and affect. Normal behavior. Normal judgment and thought content.   Assessment and Plan:  Pregnancy: G2P0010 at 6263w3d  1. Susceptible to varicella (non-immune), currently pregnant     Varicella postpartum   2. Supervision of normal first pregnancy, antepartum     Doing well - MaterniT21 PLUS Core+SCA - US MFM OB FOLLOW UP; Future  3. GBS bacteriuria      PCN for delivery/labor.  Preterm labor symptoms and general obstetric precautions including but not limited to vaginal bleeding, contractions, leaking of fluid and fetal movement were reviewed in detail with the  patient. Please refer to After Visit Summary for other counseling recommendations.  Return in about 4 weeks (around 07/22/2016) for ROB.   Roe Coombsachelle A Somaya Grassi, CNM

## 2016-07-01 ENCOUNTER — Other Ambulatory Visit: Payer: Self-pay | Admitting: Certified Nurse Midwife

## 2016-07-01 DIAGNOSIS — Z3400 Encounter for supervision of normal first pregnancy, unspecified trimester: Secondary | ICD-10-CM

## 2016-07-01 LAB — MATERNIT21 PLUS CORE+SCA
CHROMOSOME 13: NEGATIVE
CHROMOSOME 18: NEGATIVE
CHROMOSOME 21: NEGATIVE
Y Chromosome: NOT DETECTED

## 2016-07-20 ENCOUNTER — Telehealth: Payer: Self-pay | Admitting: *Deleted

## 2016-07-20 NOTE — Telephone Encounter (Signed)
Patient is calling to reports she fell yesterday. 1:53 Call to patient- she fell while walking her dog- she fell on her side-she states she didn't fall too hard. She reports no pain, bleeding cramping,or fluid leakage. Discussed warning signs and she does have good movement.

## 2016-07-22 ENCOUNTER — Encounter: Payer: Self-pay | Admitting: Certified Nurse Midwife

## 2016-07-22 ENCOUNTER — Ambulatory Visit (INDEPENDENT_AMBULATORY_CARE_PROVIDER_SITE_OTHER): Payer: Medicaid Other | Admitting: Certified Nurse Midwife

## 2016-07-22 DIAGNOSIS — Z3482 Encounter for supervision of other normal pregnancy, second trimester: Secondary | ICD-10-CM

## 2016-07-22 DIAGNOSIS — Z3400 Encounter for supervision of normal first pregnancy, unspecified trimester: Secondary | ICD-10-CM

## 2016-07-22 NOTE — Progress Notes (Signed)
Patient reports good fetal movement, denies pain/contractions. 

## 2016-07-22 NOTE — Progress Notes (Signed)
   PRENATAL VISIT NOTE  Subjective:  Morgan Hartman is a 21 y.o. G2P0010 at 2137w3d being seen today for ongoing prenatal care.  She is currently monitored for the following issues for this low-risk pregnancy and has Supervision of normal first pregnancy, antepartum; Susceptible to varicella (non-immune), currently pregnant; and GBS bacteriuria on her problem list.  Patient reports no complaints.  Contractions: Not present. Vag. Bleeding: None.  Movement: Present. Denies leaking of fluid.   The following portions of the patient's history were reviewed and updated as appropriate: allergies, current medications, past family history, past medical history, past social history, past surgical history and problem list. Problem list updated.  Objective:   Vitals:   07/22/16 1118  BP: 109/71  Pulse: 96  Temp: 99 F (37.2 C)  Weight: 142 lb (64.4 kg)    Fetal Status: Fetal Heart Rate (bpm): 155 Fundal Height: 24 cm Movement: Present     General:  Alert, oriented and cooperative. Patient is in no acute distress.  Skin: Skin is warm and dry. No rash noted.   Cardiovascular: Normal heart rate noted  Respiratory: Normal respiratory effort, no problems with respiration noted  Abdomen: Soft, gravid, appropriate for gestational age. Pain/Pressure: Absent     Pelvic:  Cervical exam deferred        Extremities: Normal range of motion.  Edema: None  Mental Status: Normal mood and affect. Normal behavior. Normal judgment and thought content.   Assessment and Plan:  Pregnancy: G2P0010 at 4237w3d  1. Supervision of normal first pregnancy, antepartum  Pre-term labor symptoms and general obstetric precautions including but not limited to vaginal bleeding, contractions, leaking of fluid and fetal movement were reviewed in detail with the patient. Please refer to After Visit Summary for other counseling recommendations.  4wk followup ROB, 2 hr gtt next 4 wk scheduled visit.   Elinor ParkinsonShanika K Joaquin Knebel,  Student-MidWife

## 2016-07-27 ENCOUNTER — Telehealth: Payer: Self-pay | Admitting: *Deleted

## 2016-07-27 NOTE — Telephone Encounter (Signed)
Pt called to office with concerns after finding tick on her leg.  Attempt to contact pt.  LM on VM to call office.

## 2016-07-28 ENCOUNTER — Telehealth: Payer: Self-pay | Admitting: *Deleted

## 2016-07-28 NOTE — Telephone Encounter (Signed)
Pt states she found tick on her leg and wanted to let office know, anything she should do?  Return call to pt. Advised pt to watch for any signs of local infection and/or and dizziness/changes in vision.  Pt states no symptoms at this time. Pt advised to contact office should symptoms arise. Pt states understanding.

## 2016-08-05 ENCOUNTER — Other Ambulatory Visit: Payer: Self-pay | Admitting: Certified Nurse Midwife

## 2016-08-05 ENCOUNTER — Inpatient Hospital Stay (HOSPITAL_COMMUNITY)
Admission: AD | Admit: 2016-08-05 | Discharge: 2016-08-05 | Disposition: A | Discharge status: Home / Self Care | Payer: Medicaid Other | Source: Ambulatory Visit | Attending: Family Medicine | Admitting: Family Medicine

## 2016-08-05 ENCOUNTER — Encounter (HOSPITAL_COMMUNITY): Payer: Self-pay

## 2016-08-05 ENCOUNTER — Inpatient Hospital Stay (HOSPITAL_COMMUNITY): Payer: Medicaid Other

## 2016-08-05 ENCOUNTER — Ambulatory Visit (HOSPITAL_COMMUNITY)
Admission: RE | Admit: 2016-08-05 | Discharge: 2016-08-05 | Disposition: A | Discharge status: Home / Self Care | Payer: Medicaid Other | Source: Ambulatory Visit | Attending: Certified Nurse Midwife | Admitting: Certified Nurse Midwife

## 2016-08-05 DIAGNOSIS — O09892 Supervision of other high risk pregnancies, second trimester: Secondary | ICD-10-CM | POA: Insufficient documentation

## 2016-08-05 DIAGNOSIS — Z283 Underimmunization status: Secondary | ICD-10-CM | POA: Insufficient documentation

## 2016-08-05 DIAGNOSIS — O09899 Supervision of other high risk pregnancies, unspecified trimester: Secondary | ICD-10-CM

## 2016-08-05 DIAGNOSIS — Z2839 Other underimmunization status: Secondary | ICD-10-CM

## 2016-08-05 DIAGNOSIS — Z362 Encounter for other antenatal screening follow-up: Secondary | ICD-10-CM | POA: Insufficient documentation

## 2016-08-05 DIAGNOSIS — R109 Unspecified abdominal pain: Secondary | ICD-10-CM

## 2016-08-05 DIAGNOSIS — O2342 Unspecified infection of urinary tract in pregnancy, second trimester: Secondary | ICD-10-CM | POA: Insufficient documentation

## 2016-08-05 DIAGNOSIS — Z3A26 26 weeks gestation of pregnancy: Secondary | ICD-10-CM | POA: Insufficient documentation

## 2016-08-05 DIAGNOSIS — Z3400 Encounter for supervision of normal first pregnancy, unspecified trimester: Secondary | ICD-10-CM

## 2016-08-05 DIAGNOSIS — Z87442 Personal history of urinary calculi: Secondary | ICD-10-CM | POA: Diagnosis not present

## 2016-08-05 DIAGNOSIS — O2343 Unspecified infection of urinary tract in pregnancy, third trimester: Secondary | ICD-10-CM

## 2016-08-05 LAB — URINALYSIS, ROUTINE W REFLEX MICROSCOPIC
Bilirubin Urine: NEGATIVE
GLUCOSE, UA: 50 mg/dL — AB
KETONES UR: NEGATIVE mg/dL
NITRITE: POSITIVE — AB
PROTEIN: NEGATIVE mg/dL
Specific Gravity, Urine: 1.009 (ref 1.005–1.030)
pH: 6 (ref 5.0–8.0)

## 2016-08-05 MED ORDER — OXYCODONE-ACETAMINOPHEN 5-325 MG PO TABS: 1.0000 | ORAL_TABLET | Freq: Four times a day (QID) | ORAL | 0 refills | Status: DC | PRN

## 2016-08-05 MED ORDER — CEPHALEXIN 500 MG PO CAPS: 500.0000 mg | ORAL_CAPSULE | Freq: Four times a day (QID) | ORAL | 0 refills | Status: AC

## 2016-08-05 NOTE — H&P (Deleted)
Chief Complaint:  Flank Pain and Dysuria   First Provider Initiated Contact with Patient 08/05/16 1210      HPI: Morgan Hartman is a 21 y.o. G2P0010 at [redacted]w[redacted]d who presents to maternity admissions reporting flank pain and dysuria. Patient report that symptoms started yesterday, she started to develop right flank pain and burning with urination. Patient also reports some urinary hesitancy but denies increase frequency. Patient denies any nausea, vomiting, fever, chills, abdominal pain. Patient has a history of renal stone last year that patient is unsure that she has passed.  Denies contractions, leakage of fluid or vaginal bleeding. Good fetal movement.   Pregnancy Course:   Past Medical History: Past Medical History:  Diagnosis Date  . Kidney calculus 2017    Past obstetric history: OB History  Gravida Para Term Preterm AB Living  2 0 0 0 1    SAB TAB Ectopic Multiple Live Births  1 0 0        # Outcome Date GA Lbr Len/2nd Weight Sex Delivery Anes PTL Lv  2 Current           1 SAB 12/2015 [redacted]w[redacted]d             Past Surgical History: History reviewed. No pertinent surgical history.   Family History: Family History  Problem Relation Age of Onset  . Diabetes Maternal Grandmother   . Depression Maternal Grandfather   . Diabetes Paternal Grandmother     Social History: Social History  Substance Use Topics  . Smoking status: Never Smoker  . Smokeless tobacco: Never Used  . Alcohol use No    Allergies: No Known Allergies  Meds:  Prescriptions Prior to Admission  Medication Sig Dispense Refill Last Dose  . acetaminophen (TYLENOL) 325 MG tablet Take 650 mg by mouth every 6 (six) hours as needed for mild pain or headache.   08/05/2016 at Unknown time  . Prenatal Vit-Fe Fumarate-FA (PRENATAL PLUS PO) Take 1 tablet by mouth at bedtime.    08/04/2016 at Unknown time    I have reviewed patient's Past Medical Hx, Surgical Hx, Family Hx, Social Hx, medications and allergies.   ROS:  A  comprehensive ROS was negative except per HPI.    Physical Exam   Patient Vitals for the past 24 hrs:  BP Temp Temp src Pulse Resp Height Weight  08/05/16 1113 116/73 98.2 F (36.8 C) Oral 98 16  (1.651 m) 145 lb (65.8 kg)   Constitutional: Well-developed, well-nourished female in no acute distress.  Cardiovascular: normal rate Respiratory: normal effort, Right sided CVA tenderness GI: Abd soft, non-tender, gravid appropriate for gestational age. Pos BS x 4 MS: Extremities nontender, no edema, normal ROM Neurologic: Alert and oriented x 4.  GU: Neg CVAT. Pelvic: NEFG, physiologic discharge, no blood, cervix clean. No CMT   Labs: Results for orders placed or performed during the hospital encounter of 08/05/16 (from the past 24 hour(s))  Urinalysis, Routine w reflex microscopic     Status: Abnormal   Collection Time: 08/05/16 11:16 AM  Result Value Ref Range   Color, Urine YELLOW YELLOW   APPearance HAZY (A) CLEAR   Specific Gravity, Urine 1.009 1.005 - 1.030   pH 6.0 5.0 - 8.0   Glucose, UA 50 (A) NEGATIVE mg/dL   Hgb urine dipstick MODERATE (A) NEGATIVE   Bilirubin Urine NEGATIVE NEGATIVE   Ketones, ur NEGATIVE NEGATIVE mg/dL   Protein, ur NEGATIVE NEGATIVE mg/dL   Nitrite POSITIVE (A) NEGATIVE   Leukocytes,  UA LARGE (A) NEGATIVE   RBC / HPF 0-5 0 - 5 RBC/hpf   WBC, UA TOO NUMEROUS TO COUNT 0 - 5 WBC/hpf   Bacteria, UA MANY (A) NONE SEEN   Squamous Epithelial / LPF 0-5 (A) NONE SEEN   WBC Clumps PRESENT    Mucous PRESENT     Imaging:  US Renal  Result Date: 08/05/2016 CLINICAL DATA:  21 year old second trimester pregnant female with right flank pain since last night. Prior nephrolithiasis. EXAM: RENAL / URINARY TRACT ULTRASOUND COMPLETE COMPARISON:  CT Abdomen and Pelvis 10/23/2015 FINDINGS: Right Kidney: Length: 11.1 cm. Corticomedullary differentiation and cortical echotexture within normal limits. Mild to moderate right hydronephrosis. No intrarenal calculus is  evident by ultrasound. Left Kidney: Length: 10.2 cm. Echogenicity within normal limits. No mass or hydronephrosis visualized. Bladder: Appears normal for degree of bladder distention. Both ureteral jets were detected with Doppler (image 24), although the left side appears more robust. IMPRESSION: 1. Positive for mild to moderate right hydronephrosis. Differential considerations include maternal hydronephrosis of pregnancy and obstructive uropathy due to recurrent urologic calculus. 2. Normal left kidney.  Unremarkable urinary bladder. Electronically Signed   By: Odessa Fleming M.D.   On: 08/05/2016 14:43   [place] Mfm Ob Follow Up  Result Date: 08/05/2016 ----------------------------------------------------------------------  OBSTETRICS REPORT                      (Signed Final 08/05/2016 12:25 pm) ---------------------------------------------------------------------- Patient Info  ID #:       [id number]                         D.O.B.:   24-Apr-1996 (21 yrs)  Name:       Morgan Hartman                     Visit Date:  08/05/2016 10:14 am ---------------------------------------------------------------------- Performed By  Performed By:     Tomma Lightning             Ref. Address:     [address]                    [address]                                                             Road                                                             [address]                                                             [place]  [address]  Attending:        Candis Shine        Location:         [facility]                    MD  Referred By:      [facility]                    for [facility]                    [facility]) ---------------------------------------------------------------------- Orders   #  Description                                 Code   1  [place] MFM OB FOLLOW UP                         08657.84   ----------------------------------------------------------------------   #  Ordered By               Order #        Accession #    Episode #   1  RACHELLE Marjo Bicker          [id number]      [id number]     [id number]  ---------------------------------------------------------------------- Indications   [redacted] weeks gestation of pregnancy                Z3A.26   Antenatal follow-up for nonvisualized fetal    Z36.2   anatomy  ---------------------------------------------------------------------- OB History  Gravidity:    2         Term:   0        Prem:   0        SAB:   1  TOP:          0       Ectopic:  0        Living: 0 ---------------------------------------------------------------------- Fetal Evaluation  Num Of Fetuses:     1  Fetal Heart         164  Rate(bpm):  Cardiac Activity:   Observed  Presentation:       Breech  Placenta:           Anterior, above cervical os  P. Cord Insertion:  Previously Visualized  Amniotic Fluid  AFI FV:      Subjectively within normal limits                              Largest Pocket(cm)                              3.62 ---------------------------------------------------------------------- Biometry  BPD:      62.5  mm     G. Age:  25w 2d         10  %    CI:        67.27   %   70 - 86  FL/HC:      19.6   %   18.6 - 20.4  HC:       244   mm     G. Age:  26w 4d         26  %    HC/AC:      1.07       1.04 - 1.22  AC:       227   mm     G. Age:  27w 1d         62  %    FL/BPD:     76.5   %   71 - 87  FL:       47.8  mm     G. Age:  26w 0d         24  %    FL/AC:      21.1   %   20 - 24  HUM:      43.8  mm     G. Age:  26w 0d         38  %  Est. FW:     950  gm      2 lb 2 oz     56  % ---------------------------------------------------------------------- Gestational Age  LMP:           26w 3d       Date:   02/02/16                 EDD:   11/08/16  U/S Today:     26w 2d                                        EDD:   11/09/16  Best:           26w 3d    Det. By:   LMP  (02/02/16)          EDD:   11/08/16 ---------------------------------------------------------------------- Anatomy  Cranium:               Appears normal         Aortic Arch:            Previously seen  Cavum:                 Appears normal         Ductal Arch:            Previously seen  Ventricles:            Appears normal         Diaphragm:              Previously seen  Choroid Plexus:        Previously seen        Stomach:                Appears normal, left                                                                        sided  Cerebellum:  Previously seen        Abdomen:                Appears normal  Posterior Fossa:       Previously seen        Abdominal Wall:         Previously seen  Nuchal Fold:           Previously seen        Cord Vessels:           Previously seen  Face:                  Orbits and profile     Kidneys:                Appear normal                         previously seen  Lips:                  Previously seen        Bladder:                Appears normal  Thoracic:              Previously seen        Spine:                  Appears normal  Heart:                 Previously seen        Upper Extremities:      Previously seen  RVOT:                  Previously seen        Lower Extremities:      Previously seen  LVOT:                  Previously seen  Other:  Fetus appears to be a female. Heels and 5th digit previously          visualized. Nasal bone previously visualized. Open hands previously          visualized. Technically difficult due to fetal position. ---------------------------------------------------------------------- Cervix Uterus Adnexa  Cervix  Length:           3.02  cm.  Normal appearance by transabdominal scan.  Uterus  No abnormality visualized.  Left Ovary  Not visualized.  Right Ovary  Not visualized. ---------------------------------------------------------------------- Impression  Single IUP at 26w 3d  Normal interval  anatomy  Fetal growth is appropriate (56th %tile)  Anterior placenta without previa  Normal amniotic fluid volume ---------------------------------------------------------------------- Recommendations  Follow-up ultrasounds as clinically indicated. ----------------------------------------------------------------------                Candis Shine, MD Electronically Signed Final Report   08/05/2016 12:25 pm ----------------------------------------------------------------------   MAU Course: --Urine was collected for UA --Hydration was encourage --Renal US was ordered  I personally reviewed the patient's NST today, found to be REACTIVE. 150 bpm, mod var, +accels, no decels. CTX: No contractions.   MDM: Plan of care reviewed with patient, including labs and tests ordered and medical treatment.   Assessment: 1. UTI (urinary tract infection) during pregnancy, third trimester   2. Flank pain, acute   3. History of renal stone    Patient presents with flank pain and dysuria for the past  24 hr. UA showed large leukocyte and positive nitrite and moderate hemoglobin consistent with UTI. However, CVA tenderness and flank pain could also be ascending UTI involving right kidney such as pyelnonephritis. Given patient history of renal stone, would need to further characterize flank pain with renal US which showed mild to moderate hydronephrosis most likely secondary to chronic renal calculus, but no evidence of renal stone. Patient does not appear toxic, and vitals have been stable low suspicion for pyelonephritis, but given patient is pregnant will have a lower threshold to treat if needed IV antibiotics.  Plan: --Order Keflex 500 mg qid for 7 days  --Percocet 5-325 mg for breakthrough pain. --Follow up on PCP or return to MAU if develop fever, chills, and worsening pain.  Discharge home in stable condition.   Labor precautions and fetal kick counts    Allergies as of 08/05/2016   No Known  Allergies     Medication List    TAKE these medications   acetaminophen 325 MG tablet Commonly known as:  TYLENOL Take 650 mg by mouth every 6 (six) hours as needed for mild pain or headache.   cephALEXin 500 MG capsule Commonly known as:  KEFLEX Take 1 capsule (500 mg total) by mouth 4 (four) times daily. Take for 7 days   oxyCODONE-acetaminophen 5-325 MG tablet Commonly known as:  ROXICET Take 1 tablet by mouth every 6 (six) hours as needed for severe pain.   PRENATAL PLUS PO Take 1 tablet by mouth at bedtime.       Lovena Neighbours, MD OB Fellow Center for [facility], [facility] 08/05/2016 3:10 PM

## 2016-08-05 NOTE — MAU Provider Note (Signed)
Chief Complaint:  Flank Pain and Dysuria   First Provider Initiated Contact with Patient 08/05/16 1210      HPI: Morgan Hartman is a 21 y.o. G2P0010 at [redacted]w[redacted]d who presents to maternity admissions reporting flank pain and dysuria. Patient report that symptoms started yesterday, she started to develop right flank pain and burning with urination. Patient also reports some urinary hesitancy but denies increase frequency. Patient denies any nausea, vomiting, fever, chills, abdominal pain. Patient has a history of renal stone last year that patient is unsure that she has passed. She has tried Tylenol 1000 mg which resolves her pain.  Urination and movement make her pain worse.  There are no associated symptoms. Denies contractions, leakage of fluid or vaginal bleeding. Good fetal movement.   Pregnancy Course:   Past Medical History:     Past Medical History:  Diagnosis Date  . Kidney calculus 2017    Past obstetric history:                 OB History  Gravida Para Term Preterm AB Living  2 0 0 0 1    SAB TAB Ectopic Multiple Live Births  1 0 0        # Outcome Date GA Lbr Len/2nd Weight Sex Delivery Anes PTL Lv  2 Current           1 SAB 12/2015 [redacted]w[redacted]d             Past Surgical History: History reviewed. No pertinent surgical history.   Family History:      Family History  Problem Relation Age of Onset  . Diabetes Maternal Grandmother   . Depression Maternal Grandfather   . Diabetes Paternal Grandmother     Social History:     Social History  Substance Use Topics  . Smoking status: Never Smoker  . Smokeless tobacco: Never Used  . Alcohol use No    Allergies: No Known Allergies  Meds:         Prescriptions Prior to Admission  Medication Sig Dispense Refill Last Dose  . acetaminophen (TYLENOL) 325 MG tablet Take 650 mg by mouth every 6 (six) hours as needed for mild pain or headache.   08/05/2016 at Unknown time  . Prenatal Vit-Fe  Fumarate-FA (PRENATAL PLUS PO) Take 1 tablet by mouth at bedtime.    08/04/2016 at Unknown time    I have reviewed patient's Past Medical Hx, Surgical Hx, Family Hx, Social Hx, medications and allergies.   ROS:  A comprehensive ROS was negative except per HPI.    Physical Exam   Patient Vitals for the past 24 hrs:  BP Temp Temp src Pulse Resp Height Weight  08/05/16 1113 116/73 98.2 F (36.8 C) Oral 98 16  (1.651 m) 145 lb (65.8 kg)   Constitutional: Well-developed, well-nourished female in no acute distress.  Cardiovascular: normal rate Respiratory: normal effort, Right sided CVA tenderness GI: Abd soft, non-tender, gravid appropriate for gestational age. Pos BS x 4 MS: Extremities nontender, no edema, normal ROM Neurologic: Alert and oriented x 4.  GU: Neg CVAT. Pelvic: NEFG, physiologic discharge, no blood, cervix clean. No CMT   Labs: Lab Results Last 24 Hours       Results for orders placed or performed during the hospital encounter of 08/05/16 (from the past 24 hour(s))  Urinalysis, Routine w reflex microscopic     Status: Abnormal   Collection Time: 08/05/16 11:16 AM  Result Value Ref Range   Color,  Urine YELLOW YELLOW   APPearance HAZY (A) CLEAR   Specific Gravity, Urine 1.009 1.005 - 1.030   pH 6.0 5.0 - 8.0   Glucose, UA 50 (A) NEGATIVE mg/dL   Hgb urine dipstick MODERATE (A) NEGATIVE   Bilirubin Urine NEGATIVE NEGATIVE   Ketones, ur NEGATIVE NEGATIVE mg/dL   Protein, ur NEGATIVE NEGATIVE mg/dL   Nitrite POSITIVE (A) NEGATIVE   Leukocytes, UA LARGE (A) NEGATIVE   RBC / HPF 0-5 0 - 5 RBC/hpf   WBC, UA TOO NUMEROUS TO COUNT 0 - 5 WBC/hpf   Bacteria, UA MANY (A) NONE SEEN   Squamous Epithelial / LPF 0-5 (A) NONE SEEN   WBC Clumps PRESENT    Mucous PRESENT       Imaging:   Imaging Results  US Renal  Result Date: 08/05/2016 CLINICAL DATA:  21 year old second trimester pregnant female with right flank pain since last  night. Prior nephrolithiasis. EXAM: RENAL / URINARY TRACT ULTRASOUND COMPLETE COMPARISON:  CT Abdomen and Pelvis 10/23/2015 FINDINGS: Right Kidney: Length: 11.1 cm. Corticomedullary differentiation and cortical echotexture within normal limits. Mild to moderate right hydronephrosis. No intrarenal calculus is evident by ultrasound. Left Kidney: Length: 10.2 cm. Echogenicity within normal limits. No mass or hydronephrosis visualized. Bladder: Appears normal for degree of bladder distention. Both ureteral jets were detected with Doppler (image 24), although the left side appears more robust. IMPRESSION: 1. Positive for mild to moderate right hydronephrosis. Differential considerations include maternal hydronephrosis of pregnancy and obstructive uropathy due to recurrent urologic calculus. 2. Normal left kidney.  Unremarkable urinary bladder. Electronically Signed   By: Odessa Fleming M.D.   On: 08/05/2016 14:43   [place] [facility] Ob Follow Up  Result Date: 08/05/2016 ----------------------------------------------------------------------  OBSTETRICS REPORT                      (Signed Final 08/05/2016 12:25 pm) ---------------------------------------------------------------------- Patient Info  ID #:       [id number]                         D.O.B.:   21-Jan-1996 (21 yrs)  Name:       Morgan Hartman                     Visit Date:  08/05/2016 10:14 am ---------------------------------------------------------------------- Performed By  Performed By:     Tomma Lightning             Ref. Address:     [address]                    [address]                                                             Road                                                             [phone]  [place]                                                             [id number]  Attending:        Clarene Critchley Whitecar        Location:         [facility]                    MD  Referred By:      [facility]                     for [facility]                    [facility] ---------------------------------------------------------------------- Orders   #  Description                                 Code   1  [place] MFM OB FOLLOW UP                         60454.09  ----------------------------------------------------------------------   #  Ordered By               Order #        Accession #    Episode #   1  RACHELLE Marjo Bicker          [id number]      [id number]     [id number]  ---------------------------------------------------------------------- Indications   [redacted] weeks gestation of pregnancy                Z3A.26   Antenatal follow-up for nonvisualized fetal    Z36.2   anatomy  ---------------------------------------------------------------------- OB History  Gravidity:    2         Term:   0        Prem:   0        SAB:   1  TOP:          0       Ectopic:  0        Living: 0 ---------------------------------------------------------------------- Fetal Evaluation  Num Of Fetuses:     1  Fetal Heart         164  Rate(bpm):  Cardiac Activity:   Observed  Presentation:       Breech  Placenta:           Anterior, above cervical os  P. Cord Insertion:  Previously Visualized  Amniotic Fluid  AFI FV:      Subjectively within normal limits                              Largest Pocket(cm)                              3.62 ---------------------------------------------------------------------- Biometry  BPD:      62.5  mm     G. Age:  25w 2d         10  %    CI:  67.27   %   70 - 86                                                          FL/HC:      19.6   %   18.6 - 20.4  HC:       244   mm     G. Age:  26w 4d         26  %    HC/AC:      1.07       1.04 - 1.22  AC:       227   mm     G. Age:  27w 1d         62  %    FL/BPD:     76.5   %   71 - 87  FL:       47.8  mm     G. Age:  26w 0d         24  %    FL/AC:      21.1   %   20 - 24  HUM:      43.8  mm     G. Age:  26w 0d         38  %  Est. FW:     950  gm      2 lb 2 oz     56   % ---------------------------------------------------------------------- Gestational Age  LMP:           26w 3d       Date:   02/02/16                 EDD:   11/08/16  U/S Today:     26w 2d                                        EDD:   11/09/16  Best:          26w 3d    Det. By:   LMP  (02/02/16)          EDD:   11/08/16 ---------------------------------------------------------------------- Anatomy  Cranium:               Appears normal         Aortic Arch:            Previously seen  Cavum:                 Appears normal         Ductal Arch:            Previously seen  Ventricles:            Appears normal         Diaphragm:              Previously seen  Choroid Plexus:        Previously seen        Stomach:                Appears normal, left  sided  Cerebellum:            Previously seen        Abdomen:                Appears normal  Posterior Fossa:       Previously seen        Abdominal Wall:         Previously seen  Nuchal Fold:           Previously seen        Cord Vessels:           Previously seen  Face:                  Orbits and profile     Kidneys:                Appear normal                         previously seen  Lips:                  Previously seen        Bladder:                Appears normal  Thoracic:              Previously seen        Spine:                  Appears normal  Heart:                 Previously seen        Upper Extremities:      Previously seen  RVOT:                  Previously seen        Lower Extremities:      Previously seen  LVOT:                  Previously seen  Other:  Fetus appears to be a female. Heels and 5th digit previously          visualized. Nasal bone previously visualized. Open hands previously          visualized. Technically difficult due to fetal position. ---------------------------------------------------------------------- Cervix Uterus Adnexa  Cervix  Length:           3.02  cm.   Normal appearance by transabdominal scan.  Uterus  No abnormality visualized.  Left Ovary  Not visualized.  Right Ovary  Not visualized. ---------------------------------------------------------------------- Impression  Single IUP at 26w 3d  Normal interval anatomy  Fetal growth is appropriate (56th %tile)  Anterior placenta without previa  Normal amniotic fluid volume ---------------------------------------------------------------------- Recommendations  Follow-up ultrasounds as clinically indicated. ----------------------------------------------------------------------                Candis Shine, MD Electronically Signed Final Report   08/05/2016 12:25 pm ----------------------------------------------------------------------    MAU Course: --Urine was collected for UA --Hydration was encourage --Renal US was ordered  I personally reviewed the patient's NST today, found to be REACTIVE. 150 bpm, mod var, +accels, no decels. CTX: No contractions.   MDM: Plan of care reviewed with patient, including labs and tests ordered and medical treatment.   Assessment: 1. UTI (urinary tract infection) during pregnancy, third trimester   2. Flank pain, acute   3. History of  renal stone    Patient presents with flank pain and dysuria for the past 24 hr. UA showed large leukocyte and positive nitrite and moderate hemoglobin consistent with UTI. However, CVA tenderness and flank pain could also be ascending UTI involving right kidney such as pyelnonephritis. Given patient history of renal stone, would need to further characterize flank pain with renal US which showed mild to moderate hydronephrosis most likely secondary to chronic renal calculus, but no evidence of renal stone. Patient does not appear toxic, and vitals have been stable low suspicion for pyelonephritis, but given patient is pregnant will have a lower threshold to treat if needed IV antibiotics.  Plan: --Order Keflex 500 mg qid for 7  days  --Percocet 5-325 mg for breakthrough pain. --Follow up on PCP or return to MAU if develop fever, chills, and worsening pain.  Discharge home in stable condition.   Labor precautions and fetal kick counts    Allergies as of 08/05/2016   No Known Allergies        Medication List    TAKE these medications   acetaminophen 325 MG tablet Commonly known as:  TYLENOL Take 650 mg by mouth every 6 (six) hours as needed for mild pain or headache.   cephALEXin 500 MG capsule Commonly known as:  KEFLEX Take 1 capsule (500 mg total) by mouth 4 (four) times daily. Take for 7 days   oxyCODONE-acetaminophen 5-325 MG tablet Commonly known as:  ROXICET Take 1 tablet by mouth every 6 (six) hours as needed for severe pain.   PRENATAL PLUS PO Take 1 tablet by mouth at bedtime.       Lovena Neighbours, MD OB Fellow Center for [facility], [facility] 08/05/2016 3:10 PM   Midwife attestation:  I have seen and examined this patient; I agree with above documentation in the resident's note.   Paighton Godette is a 21 y.o. G2P0010 reporting right flank pain and dysuria. +FM, denies LOF, VB, contractions, n/v, fever/chills, vaginal discharge.  PE: BP 111/73 (BP Location: Left Arm)   Pulse 98   Temp 98.4 F (36.9 C) (Oral)   Resp 18   Ht 5\' 5"  (1.651 m)   Wt 145 lb (65.8 kg)   LMP 02/02/2016 Comment: 8 wks today  BMI 24.13 kg/m  Gen: calm comfortable, NAD Resp: normal effort, no distress Abd: gravid Urogyn:  Negative CVA tenderness  ROS, labs, PMH reviewed NST reactive  A/P: Rx for abx for UTI, no evidence of pyelo today. Follow up in office as scheduled  Sharen Counter, CNM  8:48 PM

## 2016-08-05 NOTE — Discharge Instructions (Signed)
Acute Urinary Retention, Female  Acute urinary retention is the temporary inability to urinate. This is an uncommon problem in women. It can be caused by:   Infection.   A side effect of a medicine.   A problem in a nearby organ that presses or squeezes on the bladder or the urethra (the tube that drains the bladder).   Psychological problems.   Surgery on your bladder, urethra, or pelvic organs that causes obstruction to the outflow of urine from your bladder.    Follow these instructions at home:  If you are sent home with a Foley catheter and a drainage system, you will need to discuss the best course of action with your health care provider. While the catheter is in, maintain a good intake of fluids. Keep the drainage bag emptied and lower than your catheter. This is so that contaminated urine will not flow back into your bladder, which could lead to a urinary tract infection.  There are two main types of drainage bags. One is a large bag that usually is used at night. It has a good capacity that will allow you to sleep through the night without having to empty it. The second type is called a leg bag. It has a smaller capacity so it needs to be emptied more frequently. However, the main advantage is that it can be attached by a leg strap and goes underneath your clothing, allowing you the freedom to move about or leave your home.  Only take over-the-counter or prescription medicines for pain, discomfort, or fever as directed by your health care provider.  Contact a health care provider if:   You develop a low-grade fever.   You experience spasms or leakage of urine with the spasms.  Get help right away if:   You develop chills or fever.   Your catheter stops draining urine.   Your catheter falls out.   You start to develop increased bleeding that does not respond to rest and increased fluid intake.  This information is not intended to replace advice given to you by your health care provider. Make sure  you discuss any questions you have with your health care provider.  Document Released: 04/12/2006 Document Revised: 09/25/2015 Document Reviewed: 09/22/2012  Elsevier Interactive Patient Education  2017 [employer]

## 2016-08-05 NOTE — MAU Note (Signed)
Pt C/O R flank pain that started last night.  Had a kidney stone last year that she never passed. Also having pain with urination.  Denies contractions, bleeding or LOF.

## 2016-08-19 ENCOUNTER — Encounter: Payer: Self-pay | Admitting: Obstetrics

## 2016-08-19 ENCOUNTER — Ambulatory Visit (INDEPENDENT_AMBULATORY_CARE_PROVIDER_SITE_OTHER): Payer: Medicaid Other | Admitting: Certified Nurse Midwife

## 2016-08-19 ENCOUNTER — Encounter: Payer: Self-pay | Admitting: Certified Nurse Midwife

## 2016-08-19 ENCOUNTER — Other Ambulatory Visit: Payer: Medicaid Other

## 2016-08-19 VITALS — BP 105/64 | HR 81 | Wt 146.9 lb

## 2016-08-19 DIAGNOSIS — O09899 Supervision of other high risk pregnancies, unspecified trimester: Secondary | ICD-10-CM

## 2016-08-19 DIAGNOSIS — Z3403 Encounter for supervision of normal first pregnancy, third trimester: Secondary | ICD-10-CM

## 2016-08-19 DIAGNOSIS — Z283 Underimmunization status: Secondary | ICD-10-CM

## 2016-08-19 DIAGNOSIS — Z23 Encounter for immunization: Secondary | ICD-10-CM

## 2016-08-19 DIAGNOSIS — Z3400 Encounter for supervision of normal first pregnancy, unspecified trimester: Secondary | ICD-10-CM

## 2016-08-19 DIAGNOSIS — R8271 Bacteriuria: Secondary | ICD-10-CM

## 2016-08-19 NOTE — Progress Notes (Signed)
   PRENATAL VISIT NOTE  Subjective:  Morgan Hartman is a 21 y.o. G2P0010 at [redacted]w[redacted]d being seen today for ongoing prenatal care.  She is currently monitored for the following issues for this low-risk pregnancy and has Supervision of normal first pregnancy, antepartum; Susceptible to varicella (non-immune), currently pregnant; and GBS bacteriuria on her problem list.  Patient reports no complaints.  Contractions: Not present. Vag. Bleeding: None.  Movement: Present. Denies leaking of fluid.   The following portions of the patient's history were reviewed and updated as appropriate: allergies, current medications, past family history, past medical history, past social history, past surgical history and problem list. Problem list updated.  Objective:   Vitals:   08/19/16 0935  BP: 105/64  Pulse: 81  Weight: 146 lb 14.4 oz (66.6 kg)    Fetal Status: Fetal Heart Rate (bpm): 150 Fundal Height: 28 cm Movement: Present     General:  Alert, oriented and cooperative. Patient is in no acute distress.  Skin: Skin is warm and dry. No rash noted.   Cardiovascular: Normal heart rate noted  Respiratory: Normal respiratory effort, no problems with respiration noted  Abdomen: Soft, gravid, appropriate for gestational age. Pain/Pressure: Absent     Pelvic:  Cervical exam deferred        Extremities: Normal range of motion.  Edema: None  Mental Status: Normal mood and affect. Normal behavior. Normal judgment and thought content.   Assessment and Plan:  Pregnancy: G2P0010 at [redacted]w[redacted]d  1. Supervision of normal first pregnancy, antepartum      Doing well.  Dental letter/list provided.  Taking classes at [facility] currently.  - RPR - HIV antibody (with reflex) - CBC - Glucose Tolerance, 2 Hours w/1 Hour - Tdap vaccine greater than or equal to 7yo IM  2. Susceptible to varicella (non-immune), currently pregnant      Varicella postpartum  3. GBS bacteriuria     PCN for labor/delivery  Preterm labor symptoms and  general obstetric precautions including but not limited to vaginal bleeding, contractions, leaking of fluid and fetal movement were reviewed in detail with the patient. Please refer to After Visit Summary for other counseling recommendations.  Return in about 2 weeks (around 09/02/2016) for ROB.   Roe Coombs, CNM

## 2016-08-19 NOTE — Patient Instructions (Addendum)
AREA PEDIATRIC/FAMILY PRACTICE PHYSICIANS  [facility] [address], [address] [place], [place]  [address] Phone - [phone]   Fax - [phone]  [facility] [address] [place], [place] [address] Phone - [phone]   Fax - [phone]  JACK AMOS [address] [place], [place]  [address] Phone - [phone]   Fax - [phone]  [facility] [address] [place], [place]  [address] Phone - [phone]   Fax - [phone]  [facility] [address] [place], [place]  [address] Phone - [phone]   Fax - [phone]  [facility] [address], [address] [place], [place]  [address] Phone - [phone]   Fax - [phone]  [facility] [address] [place], [place]  [address] Phone - [phone]   Fax - [phone]  [facility] [address], [address] [place], [place]  [address] Phone - [phone]   Fax - [phone]  [facility] [address] [place], [place]  [address] Phone - [phone]   Fax - [phone] [facility] [address] [place], [place]  [address] Phone - [phone]   Fax - [phone]  [facility] [address] [place], [place]  [address] Phone - [phone]   Fax - [phone]  [facility] [address] [place], [place]  [address] Phone - [phone]   Fax - [phone]  [facility] [address], [address] [place], [place]  [address] Phone - [phone]   Fax - [phone]  SHILPA GOSRANI [address] [place], [place]  [address] Phone - [phone]  [facility] [address] [place], [place]  [address] Phone - [phone]   Fax - [phone]  [facility] [address] [place], [place]  [address] Phone - [phone]   Fax - [phone]  [facility] [address] [place], [place]  [address] Phone - [phone]   Fax - [phone]  [facility] [address] [place], [place]  [address] Phone - [phone]   Fax - [phone]   [facility] [address] [place], [place]  [address] Phone - [phone]   Fax - [phone]  [facility] [address] [place], [place]  [address] Phone - [phone]   Fax - [phone]  DAVID RUBIN [address] [place], [place]  [address] Phone - [phone]   Fax - [phone]  [facility] [address] , [place]  [address] Phone - [phone]   Fax - [phone]  [facility] [address] , [place]  [address] Phone - [phone]   Fax - [phone] [facility] [address] [place], [place]  [address] Phone - [phone]   Fax - [phone]  [facility] [address] [place], [place]  [address] Phone - [phone]   Fax - [phone]  [facility] [address] [place], [place]  [address] Phone - [phone]   Fax - [phone]  [facility] [place] [facility] Charlene Flemming MD [address] [place] [address] Phone [phone]  Fax [phone]  Contraception Choices Contraception (birth control) is the use of any methods or devices to prevent   pregnancy. Below are some methods to help avoid pregnancy. Hormonal methods  Contraceptive implant. This is a thin, plastic tube containing progesterone hormone. It does not contain estrogen hormone. Your health care provider inserts the tube in the inner part of the upper arm. The tube can remain in place for up to 3 years. After 3 years, the implant must be removed. The implant prevents the  ovaries from releasing an egg (ovulation), thickens the cervical mucus to prevent sperm from entering the uterus, and thins the lining of the inside of the uterus.  Progesterone-only injections. These injections are given every 3 months by your health care provider to prevent pregnancy. This synthetic progesterone hormone stops the ovaries from releasing eggs. It also thickens cervical mucus and changes the uterine lining. This makes it harder for sperm to survive in the uterus.  Birth control pills. These pills contain estrogen and progesterone hormone. They work by preventing the ovaries from releasing eggs (ovulation). They also cause the cervical mucus to thicken, preventing the sperm from entering the uterus. Birth control pills are prescribed by a health care provider.Birth control pills can also be used to treat heavy periods.  Minipill. This type of birth control pill contains only the progesterone hormone. They are taken every day of each month and must be prescribed by your health care provider.  Birth control patch. The patch contains hormones similar to those in birth control pills. It must be changed once a week and is prescribed by a health care provider.  Vaginal ring. The ring contains hormones similar to those in birth control pills. It is left in the vagina for 3 weeks, removed for 1 week, and then a new one is put back in place. The patient must be comfortable inserting and removing the ring from the vagina.A health care provider's prescription is necessary.  Emergency contraception. Emergency contraceptives prevent pregnancy after unprotected sexual intercourse. This pill can be taken right after sex or up to 5 days after unprotected sex. It is most effective the sooner you take the pills after having sexual intercourse. Most emergency contraceptive pills are available without a prescription. Check with your pharmacist. Do not use emergency contraception as your only form of birth  control. Barrier methods  Female condom. This is a thin sheath (latex or rubber) that is worn over the penis during sexual intercourse. It can be used with spermicide to increase effectiveness.  Female condom. This is a soft, loose-fitting sheath that is put into the vagina before sexual intercourse.  Diaphragm. This is a soft, latex, dome-shaped barrier that must be fitted by a health care provider. It is inserted into the vagina, along with a spermicidal jelly. It is inserted before intercourse. The diaphragm should be left in the vagina for 6 to 8 hours after intercourse.  Cervical cap. This is a round, soft, latex or plastic cup that fits over the cervix and must be fitted by a health care provider. The cap can be left in place for up to 48 hours after intercourse.  Sponge. This is a soft, circular piece of polyurethane foam. The sponge has spermicide in it. It is inserted into the vagina after wetting it and before sexual intercourse.  Spermicides. These are chemicals that kill or block sperm from entering the cervix and uterus. They come in the form of creams, jellies, suppositories, foam, or tablets. They do not require a prescription. They are inserted into the vagina with an applicator before having sexual intercourse. The process   must be repeated every time you have sexual intercourse. Intrauterine contraception  Intrauterine device (IUD). This is a T-shaped device that is put in a woman's uterus during a menstrual period to prevent pregnancy. There are 2 types: ? Copper IUD. This type of IUD is wrapped in copper wire and is placed inside the uterus. Copper makes the uterus and fallopian tubes produce a fluid that kills sperm. It can stay in place for 10 years. ? Hormone IUD. This type of IUD contains the hormone progestin (synthetic progesterone). The hormone thickens the cervical mucus and prevents sperm from entering the uterus, and it also thins the uterine lining to prevent  implantation of a fertilized egg. The hormone can weaken or kill the sperm that get into the uterus. It can stay in place for 3-5 years, depending on which type of IUD is used. Permanent methods of contraception  Female tubal ligation. This is when the woman's fallopian tubes are surgically sealed, tied, or blocked to prevent the egg from traveling to the uterus.  Hysteroscopic sterilization. This involves placing a small coil or insert into each fallopian tube. Your doctor uses a technique called hysteroscopy to do the procedure. The device causes scar tissue to form. This results in permanent blockage of the fallopian tubes, so the sperm cannot fertilize the egg. It takes about 3 months after the procedure for the tubes to become blocked. You must use another form of birth control for these 3 months.  Female sterilization. This is when the female has the tubes that carry sperm tied off (vasectomy).This blocks sperm from entering the vagina during sexual intercourse. After the procedure, the man can still ejaculate fluid (semen). Natural planning methods  Natural family planning. This is not having sexual intercourse or using a barrier method (condom, diaphragm, cervical cap) on days the woman could become pregnant.  Calendar method. This is keeping track of the length of each menstrual cycle and identifying when you are fertile.  Ovulation method. This is avoiding sexual intercourse during ovulation.  Symptothermal method. This is avoiding sexual intercourse during ovulation, using a thermometer and ovulation symptoms.  Post-ovulation method. This is timing sexual intercourse after you have ovulated. Regardless of which type or method of contraception you choose, it is important that you use condoms to protect against the transmission of sexually transmitted infections (STIs). Talk with your health care provider about which form of contraception is most appropriate for you. This information is not  intended to replace advice given to you by your health care provider. Make sure you discuss any questions you have with your health care provider. Document Released: 04/13/2005 Document Revised: 09/19/2015 Document Reviewed: 10/06/2012 Elsevier Interactive Patient Education  2017 Elsevier Inc.  

## 2016-08-19 NOTE — Progress Notes (Signed)
TDAP given 08/19/16

## 2016-08-20 ENCOUNTER — Other Ambulatory Visit: Payer: Self-pay | Admitting: Certified Nurse Midwife

## 2016-08-20 DIAGNOSIS — Z3400 Encounter for supervision of normal first pregnancy, unspecified trimester: Secondary | ICD-10-CM

## 2016-08-20 LAB — CBC
Hematocrit: 32.4 % — ABNORMAL LOW (ref 34.0–46.6)
Hemoglobin: 10.7 g/dL — ABNORMAL LOW (ref 11.1–15.9)
MCH: 29.6 pg (ref 26.6–33.0)
MCHC: 33 g/dL (ref 31.5–35.7)
MCV: 90 fL (ref 79–97)
PLATELETS: 312 10*3/uL (ref 150–379)
RBC: 3.62 x10E6/uL — AB (ref 3.77–5.28)
RDW: 14 % (ref 12.3–15.4)
WBC: 6.2 10*3/uL (ref 3.4–10.8)

## 2016-08-20 LAB — GLUCOSE TOLERANCE, 2 HOURS W/ 1HR
Glucose, 1 hour: 101 mg/dL (ref 65–179)
Glucose, 2 hour: 72 mg/dL (ref 65–152)
Glucose, Fasting: 70 mg/dL (ref 65–91)

## 2016-08-20 LAB — HIV ANTIBODY (ROUTINE TESTING W REFLEX): HIV Screen 4th Generation wRfx: NONREACTIVE

## 2016-08-20 LAB — RPR: RPR Ser Ql: NONREACTIVE

## 2016-09-02 ENCOUNTER — Ambulatory Visit (INDEPENDENT_AMBULATORY_CARE_PROVIDER_SITE_OTHER): Payer: Medicaid Other | Admitting: Certified Nurse Midwife

## 2016-09-02 ENCOUNTER — Encounter: Payer: Self-pay | Admitting: Certified Nurse Midwife

## 2016-09-02 VITALS — BP 99/64 | HR 71 | Wt 151.0 lb

## 2016-09-02 DIAGNOSIS — Z283 Underimmunization status: Secondary | ICD-10-CM

## 2016-09-02 DIAGNOSIS — O09893 Supervision of other high risk pregnancies, third trimester: Secondary | ICD-10-CM

## 2016-09-02 DIAGNOSIS — Z3483 Encounter for supervision of other normal pregnancy, third trimester: Secondary | ICD-10-CM

## 2016-09-02 DIAGNOSIS — Z3400 Encounter for supervision of normal first pregnancy, unspecified trimester: Secondary | ICD-10-CM

## 2016-09-02 DIAGNOSIS — Z2839 Other underimmunization status: Secondary | ICD-10-CM

## 2016-09-02 DIAGNOSIS — O09899 Supervision of other high risk pregnancies, unspecified trimester: Secondary | ICD-10-CM

## 2016-09-02 DIAGNOSIS — R8271 Bacteriuria: Secondary | ICD-10-CM

## 2016-09-02 NOTE — Progress Notes (Signed)
Patient is in the office, reports good fetal movement, denies pain/contractions.

## 2016-09-02 NOTE — Patient Instructions (Signed)
AREA PEDIATRIC/FAMILY PRACTICE PHYSICIANS  [facility] [address] [place], [place]  [address] Phone - [phone]   Fax - [phone]  [facility] [address] [place], [place] [address] Phone - [phone]   Fax - [phone]  JACK AMOS [address] [place], [place]  [address] Phone - [phone]   Fax - [phone]  [facility] [address] [place], [place]  [address] Phone - [phone]   Fax - [phone]  [facility] [address] [place], [place]  [address] Phone - [phone]   Fax - [phone]  [facility] [address] [place], [place]  [address] Phone - [phone]   Fax - [phone]  [facility] [address] [place], [place]  [address] Phone - [phone]   Fax - [phone]  [facility] [address] [place], [place]  [address] Phone - [phone]   Fax - [phone]  [facility] [address] [place], [place]  [address] Phone - [phone]   Fax - [phone] [facility] [address] [place], [place]  [address] Phone - [phone]   Fax - [phone]  [facility] [address] [place], [place]  [address] Phone - [phone]   Fax - [phone]  [facility] [address] [place], [place]  [address] Phone - [phone]   Fax - [phone]  [facility] [address] [place], [place]  [address] Phone - [phone]   Fax - [phone]  SHILPA GOSRANI [address] [place], [place]  [address] Phone - [phone]  [facility] [address] [place], [place]  [address] Phone - [phone]   Fax - [phone]  [facility] [address] [place], [place]  [address] Phone - [phone]   Fax - [phone]  [facility] [address] [place], [place]  [address] Phone - [phone]   Fax - [phone]  [facility] [address] [place], [place]  [address] Phone - [phone]   Fax - [phone]   [facility] [address] [place], [place]  [address] Phone - [phone]   Fax - [phone]  [facility] [address] [place], [place]  [address] Phone - [phone]   Fax - [phone]  DAVID RUBIN [address] [place], [place]  [address] Phone - [phone]   Fax - [phone]  [facility] [address] [place], [place]  [address] Phone - [phone]   Fax - [phone]  [facility] [address] , [place]  [address] Phone - [phone]   Fax - [phone] [facility] [address] [place], [place]  [address] Phone - [phone]   Fax - [phone]  [facility] [address] [place], [place]  [address] Phone - [phone]   Fax - [phone]  [facility] [place] [address] [place],   [address] Phone - [phone]   Fax - [phone]  [facility] [place] Charlene Flemming MD [address] [place]  [address] Phone [phone]  Fax [phone]   

## 2016-09-02 NOTE — Progress Notes (Signed)
   PRENATAL VISIT NOTE  Subjective:  Morgan Hartman is a 21 y.o. G2P0010 at 212w3d being seen today for ongoing prenatal care.  She is currently monitored for the following issues for this low-risk pregnancy and has Supervision of normal first pregnancy, antepartum; Susceptible to varicella (non-immune), currently pregnant; and GBS bacteriuria on her problem list.  Patient reports no complaints.  Contractions: Not present. Vag. Bleeding: None.  Movement: Present. Denies leaking of fluid.   The following portions of the patient's history were reviewed and updated as appropriate: allergies, current medications, past family history, past medical history, past social history, past surgical history and problem list. Problem list updated.  Objective:   Vitals:   09/02/16 1548  BP: 99/64  Pulse: 71  Weight: 151 lb (68.5 kg)    Fetal Status: Fetal Heart Rate (bpm): 152 Fundal Height: 30 cm Movement: Present     General:  Alert, oriented and cooperative. Patient is in no acute distress.  Skin: Skin is warm and dry. No rash noted.   Cardiovascular: Normal heart rate noted  Respiratory: Normal respiratory effort, no problems with respiration noted  Abdomen: Soft, gravid, appropriate for gestational age. Pain/Pressure: Absent     Pelvic:  Cervical exam deferred        Extremities: Normal range of motion.  Edema: Trace  Mental Status: Normal mood and affect. Normal behavior. Normal judgment and thought content.   Assessment and Plan:  Pregnancy: G2P0010 at 8412w3d   1.Supervision of normal first pregnancy, antepartum Patient reports doing well.   2. GBS bacteriuria  Prophylaxis PCN for labor/delivery  3.Susceptible to varicella (non-immune), currently pregnant Varicella postpartum   Preterm labor symptoms and general obstetric precautions including but not limited to vaginal bleeding, contractions, leaking of fluid and fetal movement were reviewed in detail with the patient. Please refer to  After Visit Summary for other counseling recommendations.  2wk  f/up ROB.  Roe Coombsenney, Rachelle A, CNM

## 2016-09-17 ENCOUNTER — Ambulatory Visit (INDEPENDENT_AMBULATORY_CARE_PROVIDER_SITE_OTHER): Payer: Medicaid Other | Admitting: Certified Nurse Midwife

## 2016-09-17 VITALS — BP 100/66 | HR 72 | Wt 151.0 lb

## 2016-09-17 DIAGNOSIS — O09899 Supervision of other high risk pregnancies, unspecified trimester: Secondary | ICD-10-CM

## 2016-09-17 DIAGNOSIS — R8271 Bacteriuria: Secondary | ICD-10-CM

## 2016-09-17 DIAGNOSIS — Z283 Underimmunization status: Secondary | ICD-10-CM

## 2016-09-17 DIAGNOSIS — Z3492 Encounter for supervision of normal pregnancy, unspecified, second trimester: Secondary | ICD-10-CM

## 2016-09-17 DIAGNOSIS — Z3400 Encounter for supervision of normal first pregnancy, unspecified trimester: Secondary | ICD-10-CM

## 2016-09-17 NOTE — Progress Notes (Signed)
   PRENATAL VISIT NOTE  Subjective:  Morgan Hartman is a 21 y.o. G2P0010 at 6118w4d being seen today for ongoing prenatal care.  She is currently monitored for the following issues for this low-risk pregnancy and has Supervision of normal first pregnancy, antepartum; Susceptible to varicella (non-immune), currently pregnant; and GBS bacteriuria on her problem list.  Patient reports no complaints.  Contractions: Not present. Vag. Bleeding: None.  Movement: Present. Denies leaking of fluid.   The following portions of the patient's history were reviewed and updated as appropriate: allergies, current medications, past family history, past medical history, past social history, past surgical history and problem list. Problem list updated.  Objective:   Vitals:   09/17/16 1403  BP: 100/66  Pulse: 72  Weight: 151 lb (68.5 kg)    Fetal Status: Fetal Heart Rate (bpm): 154 Fundal Height: 32 cm Movement: Present     General:  Alert, oriented and cooperative. Patient is in no acute distress.  Skin: Skin is warm and dry. No rash noted.   Cardiovascular: Normal heart rate noted  Respiratory: Normal respiratory effort, no problems with respiration noted  Abdomen: Soft, gravid, appropriate for gestational age. Pain/Pressure: Absent     Pelvic:  Cervical exam deferred        Extremities: Normal range of motion.     Mental Status: Normal mood and affect. Normal behavior. Normal judgment and thought content.   Assessment and Plan:  Pregnancy: G2P0010 at 1018w4d  1. Supervision of normal first pregnancy, antepartum     Doing well  2. Susceptible to varicella (non-immune), currently pregnant     Varicella postpartum   3. GBS bacteriuria     PCN for labor/delivery  Preterm labor symptoms and general obstetric precautions including but not limited to vaginal bleeding, contractions, leaking of fluid and fetal movement were reviewed in detail with the patient. Please refer to After Visit Summary for other  counseling recommendations.  Return in about 2 weeks (around 10/01/2016) for ROB.   Roe Coombsachelle A Zamariya Neal, CNM

## 2016-10-02 ENCOUNTER — Ambulatory Visit (INDEPENDENT_AMBULATORY_CARE_PROVIDER_SITE_OTHER): Payer: Medicaid Other | Admitting: Certified Nurse Midwife

## 2016-10-02 ENCOUNTER — Encounter: Payer: Self-pay | Admitting: Certified Nurse Midwife

## 2016-10-02 VITALS — BP 101/63 | HR 75 | Wt 154.3 lb

## 2016-10-02 DIAGNOSIS — R8271 Bacteriuria: Secondary | ICD-10-CM

## 2016-10-02 DIAGNOSIS — O09899 Supervision of other high risk pregnancies, unspecified trimester: Secondary | ICD-10-CM

## 2016-10-02 DIAGNOSIS — Z2839 Other underimmunization status: Secondary | ICD-10-CM

## 2016-10-02 DIAGNOSIS — Z3483 Encounter for supervision of other normal pregnancy, third trimester: Secondary | ICD-10-CM

## 2016-10-02 DIAGNOSIS — Z283 Underimmunization status: Secondary | ICD-10-CM

## 2016-10-02 DIAGNOSIS — Z3400 Encounter for supervision of normal first pregnancy, unspecified trimester: Secondary | ICD-10-CM

## 2016-10-02 NOTE — Progress Notes (Signed)
   PRENATAL VISIT NOTE  Subjective:  Morgan Hartman is a 21 y.o. G2P0010 at 1860w5d being seen today for ongoing prenatal care.  She is currently monitored for the following issues for this low-risk pregnancy and has Supervision of normal first pregnancy, antepartum; Susceptible to varicella (non-immune), currently pregnant; and GBS bacteriuria on her problem list.  Patient reports no complaints.  Contractions: Irregular. Vag. Bleeding: None.  Movement: Present. Denies leaking of fluid.   The following portions of the patient's history were reviewed and updated as appropriate: allergies, current medications, past family history, past medical history, past social history, past surgical history and problem list. Problem list updated.  Objective:   Vitals:   10/02/16 1104  BP: 101/63  Pulse: 75  Weight: 154 lb 4.8 oz (70 kg)    Fetal Status: Fetal Heart Rate (bpm): 150 Fundal Height: 34 cm Movement: Present     General:  Alert, oriented and cooperative. Patient is in no acute distress.  Skin: Skin is warm and dry. No rash noted.   Cardiovascular: Normal heart rate noted  Respiratory: Normal respiratory effort, no problems with respiration noted  Abdomen: Soft, gravid, appropriate for gestational age. Pain/Pressure: Absent     Pelvic:  Cervical exam deferred        Extremities: Normal range of motion.  Edema: Trace  Mental Status: Normal mood and affect. Normal behavior. Normal judgment and thought content.   Assessment and Plan:  Pregnancy: G2P0010 at 6360w5d  1. Supervision of normal first pregnancy, antepartum     Doing well.   2. Susceptible to varicella (non-immune), currently pregnant     Varicella postpartum  3. GBS bacteriuria     PCN for labor/delivery discussed.   Preterm labor symptoms and general obstetric precautions including but not limited to vaginal bleeding, contractions, leaking of fluid and fetal movement were reviewed in detail with the patient. Please refer to  After Visit Summary for other counseling recommendations.  Return in about 2 weeks (around 10/16/2016) for ROB.   Roe Coombsachelle A Marketa Midkiff, CNM

## 2016-10-02 NOTE — Progress Notes (Signed)
Patient reports good fetal movement and has occasional contractions.

## 2016-10-16 ENCOUNTER — Other Ambulatory Visit (HOSPITAL_COMMUNITY)
Admission: RE | Admit: 2016-10-16 | Discharge: 2016-10-16 | Disposition: A | Discharge status: Home / Self Care | Payer: Medicaid Other | Source: Ambulatory Visit | Attending: Certified Nurse Midwife | Admitting: Certified Nurse Midwife

## 2016-10-16 ENCOUNTER — Encounter: Payer: Self-pay | Admitting: Certified Nurse Midwife

## 2016-10-16 ENCOUNTER — Ambulatory Visit (INDEPENDENT_AMBULATORY_CARE_PROVIDER_SITE_OTHER): Payer: Medicaid Other | Admitting: Certified Nurse Midwife

## 2016-10-16 VITALS — BP 128/74 | HR 82 | Wt 158.0 lb

## 2016-10-16 DIAGNOSIS — Z3483 Encounter for supervision of other normal pregnancy, third trimester: Secondary | ICD-10-CM

## 2016-10-16 DIAGNOSIS — Z3403 Encounter for supervision of normal first pregnancy, third trimester: Secondary | ICD-10-CM | POA: Insufficient documentation

## 2016-10-16 DIAGNOSIS — R8271 Bacteriuria: Secondary | ICD-10-CM | POA: Diagnosis not present

## 2016-10-16 DIAGNOSIS — Z283 Underimmunization status: Secondary | ICD-10-CM

## 2016-10-16 DIAGNOSIS — O09893 Supervision of other high risk pregnancies, third trimester: Secondary | ICD-10-CM

## 2016-10-16 DIAGNOSIS — Z3A36 36 weeks gestation of pregnancy: Secondary | ICD-10-CM | POA: Insufficient documentation

## 2016-10-16 DIAGNOSIS — Z3400 Encounter for supervision of normal first pregnancy, unspecified trimester: Secondary | ICD-10-CM

## 2016-10-16 DIAGNOSIS — O09899 Supervision of other high risk pregnancies, unspecified trimester: Secondary | ICD-10-CM

## 2016-10-16 DIAGNOSIS — O26893 Other specified pregnancy related conditions, third trimester: Secondary | ICD-10-CM | POA: Diagnosis not present

## 2016-10-16 DIAGNOSIS — Z2839 Other underimmunization status: Secondary | ICD-10-CM

## 2016-10-16 NOTE — Progress Notes (Signed)
Patient hasa no concerns today

## 2016-10-16 NOTE — Progress Notes (Signed)
   PRENATAL VISIT NOTE  Subjective:  Morgan Hartman is a 21 y.o. G2P0010 at 7429w5d being seen today for ongoing prenatal care.  She is currently monitored for the following issues for this low-risk pregnancy and has Supervision of normal first pregnancy, antepartum; Susceptible to varicella (non-immune), currently pregnant; and GBS bacteriuria on her problem list.  Patient reports no complaints.  Contractions: Not present. Vag. Bleeding: None.  Movement: Present. Denies leaking of fluid.   The following portions of the patient's history were reviewed and updated as appropriate: allergies, current medications, past family history, past medical history, past social history, past surgical history and problem list. Problem list updated.  Objective:   Vitals:   10/16/16 1108  BP: 128/74  Pulse: 82  Weight: 158 lb (71.7 kg)    Fetal Status: Fetal Heart Rate (bpm): 148 Fundal Height: 36 cm Movement: Present  Presentation: Vertex  General:  Alert, oriented and cooperative. Patient is in no acute distress.  Skin: Skin is warm and dry. No rash noted.   Cardiovascular: Normal heart rate noted  Respiratory: Normal respiratory effort, no problems with respiration noted  Abdomen: Soft, gravid, appropriate for gestational age. Pain/Pressure: Absent     Pelvic:  Cervical exam performed Dilation: 1 Effacement (%): 50 Station: -3  Extremities: Normal range of motion.  Edema: None  Mental Status: Normal mood and affect. Normal behavior. Normal judgment and thought content.   Assessment and Plan:  Pregnancy: G2P0010 at 5229w5d  1. Supervision of normal first pregnancy, antepartum       - Cervicovaginal ancillary only  2. Susceptible to varicella (non-immune), currently pregnant      Varicella postpartum  3. GBS bacteriuria     PCN for labor/delivery  Preterm labor symptoms and general obstetric precautions including but not limited to vaginal bleeding, contractions, leaking of fluid and fetal  movement were reviewed in detail with the patient. Please refer to After Visit Summary for other counseling recommendations.  Return in about 1 week (around 10/23/2016) for ROB.   Roe Coombsachelle A Hassell Patras, CNM

## 2016-10-20 ENCOUNTER — Other Ambulatory Visit: Payer: Self-pay | Admitting: Certified Nurse Midwife

## 2016-10-20 ENCOUNTER — Encounter: Payer: Self-pay | Admitting: Certified Nurse Midwife

## 2016-10-20 ENCOUNTER — Ambulatory Visit (INDEPENDENT_AMBULATORY_CARE_PROVIDER_SITE_OTHER): Payer: Medicaid Other | Admitting: Certified Nurse Midwife

## 2016-10-20 VITALS — BP 119/76 | HR 88 | Wt 159.8 lb

## 2016-10-20 DIAGNOSIS — B373 Candidiasis of vulva and vagina: Secondary | ICD-10-CM

## 2016-10-20 DIAGNOSIS — Z283 Underimmunization status: Secondary | ICD-10-CM

## 2016-10-20 DIAGNOSIS — R8271 Bacteriuria: Secondary | ICD-10-CM

## 2016-10-20 DIAGNOSIS — B3731 Acute candidiasis of vulva and vagina: Secondary | ICD-10-CM

## 2016-10-20 DIAGNOSIS — O09899 Supervision of other high risk pregnancies, unspecified trimester: Secondary | ICD-10-CM

## 2016-10-20 DIAGNOSIS — Z3400 Encounter for supervision of normal first pregnancy, unspecified trimester: Secondary | ICD-10-CM

## 2016-10-20 DIAGNOSIS — Z3483 Encounter for supervision of other normal pregnancy, third trimester: Secondary | ICD-10-CM

## 2016-10-20 DIAGNOSIS — O09893 Supervision of other high risk pregnancies, third trimester: Secondary | ICD-10-CM

## 2016-10-20 LAB — CERVICOVAGINAL ANCILLARY ONLY
Bacterial vaginitis: NEGATIVE
CHLAMYDIA, DNA PROBE: NEGATIVE
Candida vaginitis: POSITIVE — AB
NEISSERIA GONORRHEA: NEGATIVE
TRICH (WINDOWPATH): NEGATIVE

## 2016-10-20 MED ORDER — FLUCONAZOLE 150 MG PO TABS: 150.0000 mg | ORAL_TABLET | Freq: Once | ORAL | 0 refills | Status: AC

## 2016-10-20 NOTE — Progress Notes (Signed)
   PRENATAL VISIT NOTE  Subjective:  Morgan Hartman is a 21 y.o. G2P0010 at 221w2d being seen today for ongoing prenatal care.  She is currently monitored for the following issues for this low-risk pregnancy and has Supervision of normal first pregnancy, antepartum; Susceptible to varicella (non-immune), currently pregnant; and GBS bacteriuria on her problem list.  Patient reports no complaints.  Contractions: Not present. Vag. Bleeding: None.  Movement: Present. Denies leaking of fluid.   The following portions of the patient's history were reviewed and updated as appropriate: allergies, current medications, past family history, past medical history, past social history, past surgical history and problem list. Problem list updated.  Objective:   Vitals:   10/20/16 1410  BP: 119/76  Pulse: 88  Weight: 159 lb 12.8 oz (72.5 kg)    Fetal Status: Fetal Heart Rate (bpm): 139 Fundal Height: 37 cm Movement: Present     General:  Alert, oriented and cooperative. Patient is in no acute distress.  Skin: Skin is warm and dry. No rash noted.   Cardiovascular: Normal heart rate noted  Respiratory: Normal respiratory effort, no problems with respiration noted  Abdomen: Soft, gravid, appropriate for gestational age. Pain/Pressure: Absent     Pelvic:  Cervical exam deferred        Extremities: Normal range of motion.  Edema: Trace  Mental Status: Normal mood and affect. Normal behavior. Normal judgment and thought content.   Assessment and Plan:  Pregnancy: G2P0010 at 227w2d  1. Supervision of normal first pregnancy, antepartum      Doing well  2. Susceptible to varicella (non-immune), currently pregnant     Varicella postpartum  3. GBS bacteriuria      PCN for labor/delivery  Term labor symptoms and general obstetric precautions including but not limited to vaginal bleeding, contractions, leaking of fluid and fetal movement were reviewed in detail with the patient. Please refer to After Visit  Summary for other counseling recommendations.  Return in about 1 week (around 10/27/2016) for ROB.   Roe Coombsachelle A Lore Polka, CNM

## 2016-10-27 ENCOUNTER — Ambulatory Visit (INDEPENDENT_AMBULATORY_CARE_PROVIDER_SITE_OTHER): Payer: Medicaid Other | Admitting: Certified Nurse Midwife

## 2016-10-27 VITALS — BP 116/73 | HR 66 | Wt 158.4 lb

## 2016-10-27 DIAGNOSIS — R8271 Bacteriuria: Secondary | ICD-10-CM

## 2016-10-27 DIAGNOSIS — Z3400 Encounter for supervision of normal first pregnancy, unspecified trimester: Secondary | ICD-10-CM

## 2016-10-27 DIAGNOSIS — O09899 Supervision of other high risk pregnancies, unspecified trimester: Secondary | ICD-10-CM

## 2016-10-27 DIAGNOSIS — Z283 Underimmunization status: Secondary | ICD-10-CM

## 2016-10-27 DIAGNOSIS — Z3483 Encounter for supervision of other normal pregnancy, third trimester: Secondary | ICD-10-CM

## 2016-10-27 NOTE — Progress Notes (Signed)
ver  PRENATAL VISIT NOTE  Subjective:  Morgan Hartman is a 21 y.o. G2P0010 at 2954w2d being seen today for ongoing prenatal care.  She is currently monitored for the following issues for this low-risk pregnancy and has Supervision of normal first pregnancy, antepartum; Susceptible to varicella (non-immune), currently pregnant; and GBS bacteriuria on her problem list.  Patient reports no complaints.  Contractions: Not present. Vag. Bleeding: None.  Movement: Present. Denies leaking of fluid.   The following portions of the patient's history were reviewed and updated as appropriate: allergies, current medications, past family history, past medical history, past social history, past surgical history and problem list. Problem list updated.  Objective:   Vitals:   10/27/16 1057  BP: 116/73  Pulse: 66  Weight: 158 lb 6.4 oz (71.8 kg)    Fetal Status: Fetal Heart Rate (bpm): 138 Fundal Height: 35 cm Movement: Present  Presentation: Vertex  General:  Alert, oriented and cooperative. Patient is in no acute distress.  Skin: Skin is warm and dry. No rash noted.   Cardiovascular: Normal heart rate noted  Respiratory: Normal respiratory effort, no problems with respiration noted  Abdomen: Soft, gravid, appropriate for gestational age. Pain/Pressure: Present     Pelvic:  Cervical exam performed Dilation: 1.5 Effacement (%): 60 Station: -2  Extremities: Normal range of motion.  Edema: None  Mental Status: Normal mood and affect. Normal behavior. Normal judgment and thought content.   Assessment and Plan:  Pregnancy: G2P0010 at 854w2d  1. Supervision of normal first pregnancy, antepartum     Doing well  2. Susceptible to varicella (non-immune), currently pregnant     Varicella postpartum  3. GBS bacteriuria     PCN for labor/delivery  Preterm labor symptoms and general obstetric precautions including but not limited to vaginal bleeding, contractions, leaking of fluid and fetal movement were  reviewed in detail with the patient. Please refer to After Visit Summary for other counseling recommendations.  Return in about 1 week (around 11/03/2016) for ROB.   Roe Coombsachelle A Ceylon Arenson, CNM

## 2016-10-27 NOTE — Progress Notes (Signed)
Patient wants to have her membranes stripped

## 2016-10-28 ENCOUNTER — Inpatient Hospital Stay (HOSPITAL_COMMUNITY)
Admission: AD | Admit: 2016-10-28 | Discharge: 2016-10-31 | DRG: 775 | Disposition: A | Discharge status: Home / Self Care | Payer: Medicaid Other | Source: Ambulatory Visit | Attending: Obstetrics & Gynecology | Admitting: Obstetrics & Gynecology

## 2016-10-28 DIAGNOSIS — Z3A38 38 weeks gestation of pregnancy: Secondary | ICD-10-CM

## 2016-10-28 DIAGNOSIS — O4202 Full-term premature rupture of membranes, onset of labor within 24 hours of rupture: Principal | ICD-10-CM | POA: Diagnosis present

## 2016-10-28 DIAGNOSIS — O99824 Streptococcus B carrier state complicating childbirth: Secondary | ICD-10-CM | POA: Diagnosis present

## 2016-10-29 ENCOUNTER — Encounter (HOSPITAL_COMMUNITY): Payer: Self-pay

## 2016-10-29 DIAGNOSIS — O99824 Streptococcus B carrier state complicating childbirth: Secondary | ICD-10-CM | POA: Diagnosis present

## 2016-10-29 DIAGNOSIS — O4202 Full-term premature rupture of membranes, onset of labor within 24 hours of rupture: Secondary | ICD-10-CM | POA: Diagnosis present

## 2016-10-29 DIAGNOSIS — Z3A38 38 weeks gestation of pregnancy: Secondary | ICD-10-CM | POA: Diagnosis not present

## 2016-10-29 LAB — CBC
HCT: 35.6 % — ABNORMAL LOW (ref 36.0–46.0)
Hemoglobin: 12.1 g/dL (ref 12.0–15.0)
MCH: 30 pg (ref 26.0–34.0)
MCHC: 34 g/dL (ref 30.0–36.0)
MCV: 88.3 fL (ref 78.0–100.0)
PLATELETS: 226 10*3/uL (ref 150–400)
RBC: 4.03 MIL/uL (ref 3.87–5.11)
RDW: 14 % (ref 11.5–15.5)
WBC: 13.8 10*3/uL — AB (ref 4.0–10.5)

## 2016-10-29 LAB — RPR: RPR Ser Ql: NONREACTIVE

## 2016-10-29 LAB — TYPE AND SCREEN
ABO/RH(D): O POS
Antibody Screen: NEGATIVE

## 2016-10-29 LAB — POCT FERN TEST: POCT FERN TEST: POSITIVE

## 2016-10-29 LAB — ABO/RH: ABO/RH(D): O POS

## 2016-10-29 MED ORDER — IBUPROFEN 600 MG PO TABS
600.0000 mg | ORAL_TABLET | Freq: Four times a day (QID) | ORAL | Status: DC
  Administered 2016-10-29 – 2016-10-31 (×9): 600 mg via ORAL
  Filled 2016-10-29 (×8): qty 1

## 2016-10-29 MED ORDER — PRENATAL MULTIVITAMIN CH
1.0000 | ORAL_TABLET | Freq: Every day | ORAL | Status: DC
  Administered 2016-10-29 – 2016-10-30 (×2): 1 via ORAL
  Filled 2016-10-29: qty 1

## 2016-10-29 MED ORDER — PENICILLIN G POT IN DEXTROSE 60000 UNIT/ML IV SOLN
3.0000 10*6.[IU] | INTRAVENOUS | Status: DC
  Filled 2016-10-29 (×3): qty 50

## 2016-10-29 MED ORDER — COCONUT OIL OIL
1.0000 "application " | TOPICAL_OIL | Status: DC | PRN
  Filled 2016-10-29: qty 120

## 2016-10-29 MED ORDER — ONDANSETRON HCL 4 MG PO TABS: 4.0000 mg | ORAL_TABLET | ORAL | Status: DC | PRN

## 2016-10-29 MED ORDER — PENICILLIN G POTASSIUM 5000000 UNITS IJ SOLR
5.0000 10*6.[IU] | Freq: Once | INTRAVENOUS | Status: AC
  Administered 2016-10-29: 5 10*6.[IU] via INTRAVENOUS
  Filled 2016-10-29: qty 5

## 2016-10-29 MED ORDER — TETANUS-DIPHTH-ACELL PERTUSSIS 5-2.5-18.5 LF-MCG/0.5 IM SUSP: 0.5000 mL | Freq: Once | INTRAMUSCULAR | Status: DC

## 2016-10-29 MED ORDER — FENTANYL CITRATE (PF) 100 MCG/2ML IJ SOLN
100.0000 ug | INTRAMUSCULAR | Status: DC | PRN
  Administered 2016-10-29 (×2): 100 ug via INTRAVENOUS
  Filled 2016-10-29 (×2): qty 2

## 2016-10-29 MED ORDER — ZOLPIDEM TARTRATE 5 MG PO TABS: 5.0000 mg | ORAL_TABLET | Freq: Every evening | ORAL | Status: DC | PRN

## 2016-10-29 MED ORDER — DIPHENHYDRAMINE HCL 25 MG PO CAPS: 25.0000 mg | ORAL_CAPSULE | Freq: Four times a day (QID) | ORAL | Status: DC | PRN

## 2016-10-29 MED ORDER — SIMETHICONE 80 MG PO CHEW: 80.0000 mg | CHEWABLE_TABLET | ORAL | Status: DC | PRN

## 2016-10-29 MED ORDER — LACTATED RINGERS IV SOLN: 500.0000 mL | INTRAVENOUS | Status: DC | PRN

## 2016-10-29 MED ORDER — FLEET ENEMA 7-19 GM/118ML RE ENEM: 1.0000 | ENEMA | Freq: Once | RECTAL | Status: DC

## 2016-10-29 MED ORDER — OXYTOCIN 40 UNITS IN LACTATED RINGERS INFUSION - SIMPLE MED
2.5000 [IU]/h | INTRAVENOUS | Status: DC
  Administered 2016-10-29: 2.5 [IU]/h via INTRAVENOUS
  Filled 2016-10-29: qty 1000

## 2016-10-29 MED ORDER — OXYTOCIN BOLUS FROM INFUSION
500.0000 mL | Freq: Once | INTRAVENOUS | Status: AC
  Administered 2016-10-29: 500 mL via INTRAVENOUS

## 2016-10-29 MED ORDER — ACETAMINOPHEN 325 MG PO TABS: 650.0000 mg | ORAL_TABLET | ORAL | Status: DC | PRN

## 2016-10-29 MED ORDER — ONDANSETRON HCL 4 MG/2ML IJ SOLN: 4.0000 mg | INTRAMUSCULAR | Status: DC | PRN

## 2016-10-29 MED ORDER — ONDANSETRON HCL 4 MG/2ML IJ SOLN: 4.0000 mg | Freq: Four times a day (QID) | INTRAMUSCULAR | Status: DC | PRN

## 2016-10-29 MED ORDER — DIBUCAINE 1 % RE OINT: 1.0000 "application " | TOPICAL_OINTMENT | RECTAL | Status: DC | PRN

## 2016-10-29 MED ORDER — SOD CITRATE-CITRIC ACID 500-334 MG/5ML PO SOLN: 30.0000 mL | ORAL | Status: DC | PRN

## 2016-10-29 MED ORDER — LIDOCAINE HCL (PF) 1 % IJ SOLN
30.0000 mL | INTRAMUSCULAR | Status: DC | PRN
  Filled 2016-10-29: qty 30

## 2016-10-29 MED ORDER — LACTATED RINGERS IV SOLN
INTRAVENOUS | Status: DC
  Administered 2016-10-29: 02:00:00 via INTRAVENOUS

## 2016-10-29 MED ORDER — OXYCODONE HCL 5 MG PO TABS: 5.0000 mg | ORAL_TABLET | ORAL | Status: DC | PRN

## 2016-10-29 MED ORDER — WITCH HAZEL-GLYCERIN EX PADS: 1.0000 "application " | MEDICATED_PAD | CUTANEOUS | Status: DC | PRN

## 2016-10-29 MED ORDER — SENNOSIDES-DOCUSATE SODIUM 8.6-50 MG PO TABS
2.0000 | ORAL_TABLET | ORAL | Status: DC
  Administered 2016-10-30 (×2): 2 via ORAL
  Filled 2016-10-29 (×2): qty 2

## 2016-10-29 MED ORDER — BENZOCAINE-MENTHOL 20-0.5 % EX AERO
1.0000 "application " | INHALATION_SPRAY | CUTANEOUS | Status: DC | PRN
  Administered 2016-10-29: 1 via TOPICAL
  Filled 2016-10-29: qty 56

## 2016-10-29 MED ORDER — OXYCODONE-ACETAMINOPHEN 5-325 MG PO TABS: 1.0000 | ORAL_TABLET | ORAL | Status: DC | PRN

## 2016-10-29 MED ORDER — OXYCODONE-ACETAMINOPHEN 5-325 MG PO TABS: 2.0000 | ORAL_TABLET | ORAL | Status: DC | PRN

## 2016-10-29 NOTE — H&P (Signed)
LABOR AND DELIVERY ADMISSION HISTORY AND PHYSICAL NOTE  Morgan Hartman is a 21 y.o. female G2P0010 with IUP at 6487w4d by LMP c/w 18 week ultrasound presenting for PROM in the MAU around 0100AM. She was seen as a labor check in MAU around midnight this evening and noted to be 3/90/-1 station and vertex. She was monitored for an hour and on recheck her cervix was unchanged but she was noted to have clear fluid pooling which was fern positive. She continues to contract roughly ever 4 minutes.   She reports +FM, + contractions, No LOF, no VB, no blurry vision, headaches or peripheral edema, and RUQ pain.  She plans on breast feeding. She request IUD for birth control.   Prenatal History/Complications:  Past Medical History: Past Medical History:  Diagnosis Date  . Kidney calculus 2017    Past Surgical History: No past surgical history on file.  Obstetrical History: OB History    Gravida Para Term Preterm AB Living   2 0 0 0 1     SAB TAB Ectopic Multiple Live Births   1 0 0          Social History: Social History   Social History  . Marital status: Single    Spouse name: N/A  . Number of children: N/A  . Years of education: N/A   Social History Main Topics  . Smoking status: Never Smoker  . Smokeless tobacco: Never Used  . Alcohol use No  . Drug use: No  . Sexual activity: Yes    Birth control/ protection: None   Other Topics Concern  . Not on file   Social History Narrative   ** Merged History Encounter **        Family History: Family History  Problem Relation Age of Onset  . Diabetes Maternal Grandmother   . Depression Maternal Grandfather   . Diabetes Paternal Grandmother     Allergies: No Known Allergies  Prescriptions Prior to Admission  Medication Sig Dispense Refill Last Dose  . acetaminophen (TYLENOL) 325 MG tablet Take 650 mg by mouth every 6 (six) hours as needed for mild pain or headache.   Taking  . calcium carbonate (TUMS - DOSED IN MG ELEMENTAL  CALCIUM) 500 MG chewable tablet Chew 1 tablet by mouth daily.   Taking  . Prenatal Vit-Fe Fumarate-FA (PRENATAL PLUS PO) Take 1 tablet by mouth at bedtime.    Taking     Review of Systems   All systems reviewed and negative except as stated in HPI  Physical Exam Blood pressure 136/79, pulse (!) 103, temperature 98 F (36.7 C), temperature source Oral, resp. rate 17, last menstrual period 02/02/2016. General appearance: alert, cooperative and appears stated age  Presentation: cephalic Fetal monitoring: 150 bpm, mod var, accels no decels Uterine activity: q4758m ctx  Dilation: 3 Effacement (%): 90 Station: 0, -1 Exam by:: Latricia HeftAnna Cioce, RN   Prenatal labs: ABO, Rh: O/Positive/-- (01/03 1604) Antibody: Negative (01/03 1604) Rubella: Immune RPR: Non Reactive (04/25 1125)  HBsAg: Negative (01/03 1604)  HIV: Non Reactive (04/25 1125)  GBS:   positive urine 1 hr Glucola: WNL Genetic screening:  Normal Quad, NIPS Anatomy US: normal  Prenatal Transfer Tool  Maternal Diabetes: No Genetic Screening: Normal Maternal Ultrasounds/Referrals: Normal Fetal Ultrasounds or other Referrals:  None Maternal Substance Abuse:  No Significant Maternal Medications:  None Significant Maternal Lab Results: Lab values include: Group B Strep positive  Results for orders placed or performed during the hospital encounter  of 10/28/16 (from the past 24 hour(s))  [facility] Time: 10/29/16  1:18 AM  Result Value Ref Range   POCT Fern Test Positive = ruptured amniotic membanes     Patient Active Problem List   Diagnosis Date Noted  . GBS bacteriuria 05/04/2016  . Susceptible to varicella (non-immune), currently pregnant 04/30/2016  . Supervision of normal first pregnancy, antepartum 04/29/2016    Assessment: Morgan Hartman is a 21 y.o. G2P0010 at [redacted]w[redacted]d here for PROM  #Labor: monitor overnight, can augment if neecded #Pain: desires natural birth #FWB: Category I tracing #ID:  GBS  positive, PCN ordered #MOF: breast #MOC:IUD #Circ:  N/A  Howard Pouch, MD PGY-1 [facility] 10/29/2016, 1:22 AM  CNM attestation:  I have seen and examined this patient; I agree with above documentation in the resident's note.   Morgan Hartman is a 21 y.o. G2P0010 here for SROM/early labor  PE: BP (!) 129/45   Pulse (!) 102   Temp 98.9 F (37.2 C) (Oral)   Resp 16   Ht 5\' 5"  (1.651 m)   Wt 71.7 kg (158 lb)   LMP 02/02/2016 Comment: 8 wks today  BMI 26.29 kg/m  Gen: calm comfortable, NAD Resp: normal effort, no distress Abd: gravid  ROS, labs, PMH reviewed  Plan: Admit to [facility] Expectant management PCN for GBS ppx Anticipate SVD  Cam Hai CNM 10/29/2016, 5:41 AM

## 2016-10-29 NOTE — MAU Note (Signed)
Cramping since 1000 yesterday morning.  Started getting stronger and closer together around 2100.  Some bloody show. Felt like her underwear were wet around 2200 but not since then.

## 2016-10-29 NOTE — Progress Notes (Signed)
G2P0010 at 3557w4d presented for PROM.  Cervix 9.5 (anterior lip/rim)/90%/0 with strong urge to push. Anterior lip not easily reduced. Patient placed in hands/knees position, asked to breath through contractions and wait to push.  Continue expectant management Anticipate SVD  Howard PouchLauren Handsome Anglin, MD PGY-2 [facility]

## 2016-10-29 NOTE — Progress Notes (Signed)
UR chart review completed.  

## 2016-10-30 MED ORDER — SENNOSIDES-DOCUSATE SODIUM 8.6-50 MG PO TABS: 1.0000 | ORAL_TABLET | Freq: Every evening | ORAL | 0 refills | Status: DC | PRN

## 2016-10-30 MED ORDER — IBUPROFEN 600 MG PO TABS: 600.0000 mg | ORAL_TABLET | Freq: Four times a day (QID) | ORAL | 0 refills | Status: DC

## 2016-10-30 NOTE — Lactation Note (Signed)
This note was copied from a baby's chart. Lactation Consultation Note  Patient Name: Morgan Hartman WUJWJ'XToday's Date: 10/30/2016 Reason for consult: Initial assessment Baby at 35 hr of life. Upon entry mom was eating and there were multiple visitors. Parents report that baby has been bf well as day. She has latched "about 5 times" since lactation visited with them this am. Mom denies breast or nipple pain. Left lactation phone number on the white board for mom to call at the next bf.    Maternal Data    Feeding    LATCH Score/Interventions                      Lactation Tools Discussed/Used     Consult Status Consult Status: Follow-up Date: 10/30/16 Follow-up type: In-patient    Rulon Eisenmengerlizabeth E Aijah Lattner 10/30/2016, 6:01 PM

## 2016-10-30 NOTE — Lactation Note (Signed)
This note was copied from a baby's chart. Lactation Consultation Note  Patient Name: Morgan Hartman MVHQI'OToday's Date: 10/30/2016 Reason for consult: Follow-up assessment Baby at 38 hr of life and mom called for lactation to view a feeding. Mom was leaning over baby and letting baby only latch to the tip of the nipple. Showed mom how to lean back and bring baby to her using the cross cradle. Mom has bilateral pinpoint clear blisters on the surface of the nipples. She is using coconut oil and shells. Given comfort gels. Discussed baby behavior, feeding frequency, baby belly size, voids, wt loss, breast changes, and nipple care. Parents are aware of lactation services and support group.    Maternal Data    Feeding Feeding Type: Breast Fed Length of feed: 20 min  LATCH Score/Interventions Latch: Grasps breast easily, tongue down, lips flanged, rhythmical sucking.  Audible Swallowing: A few with stimulation Intervention(s): Skin to skin;Hand expression  Type of Nipple: Everted at rest and after stimulation  Comfort (Breast/Nipple): Filling, red/small blisters or bruises, mild/mod discomfort  Problem noted: Mild/Moderate discomfort;Cracked, bleeding, blisters, bruises Interventions  (Cracked/bleeding/bruising/blister): Expressed breast milk to nipple Interventions (Mild/moderate discomfort): Comfort gels  Hold (Positioning): Assistance needed to correctly position infant at breast and maintain latch. Intervention(s): Position options;Support Pillows  LATCH Score: 7  Lactation Tools Discussed/Used     Consult Status Consult Status: Follow-up Date: 10/31/16 Follow-up type: In-patient    Morgan Hartman 10/30/2016, 8:20 PM

## 2016-10-30 NOTE — Discharge Summary (Signed)
OB Discharge Summary     Patient Name: Morgan Hartman DOB: [birth year] MRN: [id number]  Date of admission: 10/28/2016 Delivering MD: Cam Hai D   Date of discharge: 10/30/2016  Admitting diagnosis: 38 WEEKS CTX BLEEDING Intrauterine pregnancy: [redacted]w[redacted]d     Secondary diagnosis:  Active Problems:   Normal labor  Additional problems:  Patient Active Problem List   Diagnosis Date Noted  . Normal labor 10/29/2016  . GBS bacteriuria 05/04/2016  . Susceptible to varicella (non-immune), currently pregnant 04/30/2016  . Supervision of normal first pregnancy, antepartum 04/29/2016        Discharge diagnosis: Term Pregnancy Delivered                                                                                                Augmentation: Pitocin  Complications: None  Hospital course:  Onset of Labor With Vaginal Delivery     21 y.o. yo G2P1011 at [redacted]w[redacted]d was admitted in Active Labor on 10/28/2016. Patient had an uncomplicated labor course as follows:  Membrane Rupture Time/Date: 1:15 AM ,10/29/2016   Intrapartum Procedures: Episiotomy: None [1]                                         Lacerations:  None [1]  Patient had a delivery of a Viable infant. 10/29/2016  Information for the patient's newborn:  Kadiatou, Oplinger [[id number]]  Delivery Method: Vag-Spont    Pateint had an uncomplicated postpartum course.  She is ambulating, tolerating a regular diet, passing flatus, and urinating well. Patient is discharged home in stable condition on 10/30/16.   Physical exam  Vitals:   10/29/16 0845 10/29/16 1345 10/29/16 2145 10/30/16 0537  BP: 116/64 118/65 111/62 (!) 101/56  Pulse: (!) 55 60 73 (!) 56  Resp: 18 16 18 18   Temp: 97.7 F (36.5 C) 98.2 F (36.8 C) 98.5 F (36.9 C) 97.7 F (36.5 C)  TempSrc: Oral Oral Oral Oral  Weight:      Height:       General: alert, cooperative and no distress Lochia: appropriate Uterine Fundus: firm DVT Evaluation: No evidence of DVT seen on  physical exam. Labs: Lab Results  Component Value Date   WBC 13.8 (H) 10/29/2016   HGB 12.1 10/29/2016   HCT 35.6 (L) 10/29/2016   MCV 88.3 10/29/2016   PLT 226 10/29/2016   CMP Latest Ref Rng & Units 01/07/2016  Glucose 65 - 99 mg/dL 96  BUN 6 - 20 mg/dL 10  Creatinine 0.86 - 5.78 mg/dL 4.69  Sodium 629 - 528 mmol/L 137  Potassium 3.5 - 5.1 mmol/L 3.7  Chloride 101 - 111 mmol/L 107  CO2 22 - 32 mmol/L 24  Calcium 8.9 - 10.3 mg/dL 9.2    Discharge instruction: per After Visit Summary and "Baby and Me Booklet".  After visit meds:  Allergies as of 10/30/2016   No Known Allergies     Medication List    TAKE these medications   acetaminophen 325 MG tablet  Commonly known as:  TYLENOL Take 650 mg by mouth every 6 (six) hours as needed for mild pain or headache.   calcium carbonate 500 MG chewable tablet Commonly known as:  TUMS - dosed in mg elemental calcium Chew 1 tablet by mouth 2 (two) times daily as needed for indigestion or heartburn.   ibuprofen 600 MG tablet Commonly known as:  ADVIL,MOTRIN Take 1 tablet (600 mg total) by mouth every 6 (six) hours.   PRENATAL PLUS PO Take 1 tablet by mouth at bedtime.   senna-docusate 8.6-50 MG tablet Commonly known as:  Senokot-S Take 1 tablet by mouth at bedtime as needed for mild constipation.       Diet: routine diet  Activity: Advance as tolerated. Pelvic rest for 6 weeks.   Outpatient follow up:6 weeks Follow up Appt:No future appointments. Follow up Visit:No Follow-up on file.  Postpartum contraception: IUD  or depo  Newborn Data: Live born female  Birth Weight: 6 lb 9.6 oz (2994 g) APGAR: 8, 9  Baby Feeding: Breast Disposition:home with mother  Howard PouchLauren Feng, MD PGY-2 [facility]   OB FELLOW DISCHARGE ATTESTATION  I have seen and examined this patient and agree with above documentation in the resident's note.   Jen MowElizabeth Tayvian Holycross, DO OB Fellow 8:47 AM

## 2016-10-30 NOTE — Discharge Instructions (Signed)

## 2016-10-30 NOTE — Lactation Note (Addendum)
This note was copied from a baby's chart. Lactation Consultation Note New mom states BF going well. Baby is BF off and on frequently. Educated on cluster feeding. Encouraged mom to feel breast before and after BF for transfer. Mom has slightly heavy breast. + breast change during pregnancy. Everted nipples, shells given to wear to evert more for deeper latch. Hand expression taught w/easy flow of colostrum. Mom happy. Hand pump given to pre-pump nipple for extra stimulation and evert nipple before latching.   Encouraged to call RN for next feeding to assess latch. Stressed importance of I&O, STS, supply and demand.  Mom encouraged to feed baby 8-12 times/24 hours and with feeding cues. WH/LC brochure given w/resources, support groups and LC services.  Patient Name: Morgan Hartman HYQMV'HToday's Date: 10/30/2016 Reason for consult: Initial assessment   Maternal Data Has patient been taught Hand Expression?: Yes Does the patient have breastfeeding experience prior to this delivery?: No  Feeding Feeding Type: Breast Fed Length of feed: 30 min  LATCH Score/Interventions       Type of Nipple: Everted at rest and after stimulation  Comfort (Breast/Nipple): Soft / non-tender     Intervention(s): Breastfeeding basics reviewed;Support Pillows;Position options;Skin to skin     Lactation Tools Discussed/Used Tools: Shells;Pump Shell Type: Inverted Breast pump type: Manual Pump Review: Setup, frequency, and cleaning;Milk Storage Initiated by:: Peri JeffersonL. Ollie Delano RN IBCLC Date initiated:: 10/30/16   Consult Status Consult Status: Follow-up Date: 10/30/16 (in pm) Follow-up type: In-patient    Charyl DancerCARVER, Tregan Read G 10/30/2016, 6:46 AM

## 2016-10-31 NOTE — Discharge Summary (Signed)
OB Discharge Summary                           Patient Name: Morgan Hartman DOB: January 25, 1996 MRN: [id number]  Date of admission: 10/28/2016 Delivering MD: Cam HaiSHAW, KIMBERLY D   Date of discharge: 10/30/2016  Admitting diagnosis: 38 WEEKS CTX BLEEDING Intrauterine pregnancy: 6468w4d     Secondary diagnosis:  Active Problems:   Normal labor  Additional problems:      Patient Active Problem List   Diagnosis Date Noted  . Normal labor 10/29/2016  . GBS bacteriuria 05/04/2016  . Susceptible to varicella (non-immune), currently pregnant 04/30/2016  . Supervision of normal first pregnancy, antepartum 04/29/2016                                         Discharge diagnosis: Term Pregnancy Delivered                                                                                                Augmentation: Pitocin  Complications: None  Hospital course:  Onset of Labor With Vaginal Delivery     21 y.o. yo G2P1011 at 6668w4d was admitted in Active Labor on 10/28/2016. Patient had an uncomplicated labor course as follows:  Membrane Rupture Time/Date: 1:15 AM ,10/29/2016   Intrapartum Procedures: Episiotomy: None [1]                                         Lacerations:  None [1]  Patient had a delivery of a Viable infant. 10/29/2016  Information for the patient's newborn:  Raelyn NumberRea, Girl Raiden [[id number]][[id number]]  Delivery Method: Vag-Spont    Pateint had an uncomplicated postpartum course.  She is ambulating, tolerating a regular diet, passing flatus, and urinating well. Patient is discharged home in stable condition on 10/30/16.   Physical exam        Vitals:   10/29/16 0845 10/29/16 1345 10/29/16 2145 10/30/16 0537  BP: 116/64 118/65 111/62 (!) 101/56  Pulse: (!) 55 60 73 (!) 56  Resp: 18 16 18 18   Temp: 97.7 F (36.5 C) 98.2 F (36.8 C) 98.5 F (36.9 C) 97.7 F (36.5 C)  TempSrc: Oral Oral Oral Oral  Weight:      Height:       General: alert, cooperative and no  distress Lochia: appropriate Uterine Fundus: firm DVT Evaluation: No evidence of DVT seen on physical exam. Labs: Recent Labs       Lab Results  Component Value Date   WBC 13.8 (H) 10/29/2016   HGB 12.1 10/29/2016   HCT 35.6 (L) 10/29/2016   MCV 88.3 10/29/2016   PLT 226 10/29/2016     CMP Latest Ref Rng & Units 01/07/2016  Glucose 65 - 99 mg/dL 96  BUN 6 - 20 mg/dL 10  Creatinine 7.820.44 - 9.561.00 mg/dL 2.130.45  Sodium 086135 - 578145 mmol/L 137  Potassium 3.5 - 5.1 mmol/L 3.7  Chloride 101 - 111 mmol/L 107  CO2 22 - 32 mmol/L 24  Calcium 8.9 - 10.3 mg/dL 9.2    Discharge instruction: per After Visit Summary and "Baby and Me Booklet".  After visit meds:  Allergies as of 10/30/2016   No Known Allergies        Medication List    TAKE these medications   acetaminophen 325 MG tablet Commonly known as:  TYLENOL Take 650 mg by mouth every 6 (six) hours as needed for mild pain or headache.   calcium carbonate 500 MG chewable tablet Commonly known as:  TUMS - dosed in mg elemental calcium Chew 1 tablet by mouth 2 (two) times daily as needed for indigestion or heartburn.   ibuprofen 600 MG tablet Commonly known as:  ADVIL,MOTRIN Take 1 tablet (600 mg total) by mouth every 6 (six) hours.   PRENATAL PLUS PO Take 1 tablet by mouth at bedtime.   senna-docusate 8.6-50 MG tablet Commonly known as:  Senokot-S Take 1 tablet by mouth at bedtime as needed for mild constipation.       Diet: routine diet  Activity: Advance as tolerated. Pelvic rest for 6 weeks.   Outpatient follow up:6 weeks Follow up Appt:No future appointments. Follow up Visit:No Follow-up on file.  Postpartum contraception: IUD  or depo  Newborn Data: Live born female  Birth Weight: 6 lb 9.6 oz (2994 g) APGAR: 8, 9  Baby Feeding: Breast Disposition:home with mother  Howard Pouch, MD PGY-2 [facility]  Patient was seen and examined by me also Agree with  note Vitals stable Labs stable Fundus firm, lochia within normal limits Perineum healing Ext WNL Continue care  Ready for discharge  Aviva Signs, CNM

## 2016-11-04 ENCOUNTER — Encounter: Payer: Medicaid Other | Admitting: Certified Nurse Midwife

## 2016-11-26 ENCOUNTER — Encounter: Payer: Self-pay | Admitting: Certified Nurse Midwife

## 2016-11-26 ENCOUNTER — Ambulatory Visit (INDEPENDENT_AMBULATORY_CARE_PROVIDER_SITE_OTHER): Payer: Medicaid Other | Admitting: Certified Nurse Midwife

## 2016-11-26 VITALS — BP 107/67 | HR 59 | Ht 65.0 in | Wt 142.3 lb

## 2016-11-26 DIAGNOSIS — Z30430 Encounter for insertion of intrauterine contraceptive device: Secondary | ICD-10-CM

## 2016-11-26 DIAGNOSIS — Z3202 Encounter for pregnancy test, result negative: Secondary | ICD-10-CM | POA: Diagnosis not present

## 2016-11-26 DIAGNOSIS — Z30018 Encounter for initial prescription of other contraceptives: Secondary | ICD-10-CM

## 2016-11-26 LAB — POCT URINE PREGNANCY: Preg Test, Ur: NEGATIVE

## 2016-11-26 MED ORDER — LEVONORGESTREL 20 MCG/24HR IU IUD
INTRAUTERINE_SYSTEM | Freq: Once | INTRAUTERINE | Status: AC
  Administered 2016-11-26: 11:00:00 via INTRAUTERINE

## 2016-11-26 NOTE — Progress Notes (Signed)
Post Partum Exam  Morgan Hartman is a 21 y.o. 142P1011 female who presents for a postpartum visit. She is 4 weeks postpartum following a spontaneous vaginal delivery. I have fully reviewed the prenatal and intrapartum course. The delivery was at 38.4 gestational weeks.  Anesthesia: IV sedation. Postpartum course has been good. Baby's course has been good. Baby is feeding by breast. Bleeding staining only. Bowel function is normal. Bladder function is normal. Patient is not sexually active. Contraception method is IUD. Postpartum depression screening:neg  The following portions of the patient's history were reviewed and updated as appropriate: allergies, current medications, past family history, past medical history, past social history, past surgical history and problem list.  Review of Systems Pertinent items noted in HPI and remainder of comprehensive ROS otherwise negative.    Objective:  unknown if currently breastfeeding.  General:  alert, cooperative and no distress   Breasts:  inspection negative, no nipple discharge or bleeding, no masses or nodularity palpable  Lungs: clear to auscultation bilaterally  Heart:  regular rate and rhythm, S1, S2 normal, no murmur, click, rub or gallop  Abdomen: soft, non-tender; bowel sounds normal; no masses,  no organomegaly   Vulva:  normal  Vagina: normal vagina, no discharge, exudate, lesion, or erythema  Cervix:  no cervical motion tenderness  Corpus: normal size, contour, position, consistency, mobility, non-tender  Adnexa:  normal adnexa  Rectal Exam: Not performed.        Assessment:    Normal 4 week postpartum exam. Pap smear not done at today's visit.  Pap smear: 04/29/16: normal  Plan:   1. Contraception: abstinence and IUD 2. IUD placed today.  3. Follow up in: 1 month for string check or as needed.

## 2016-11-26 NOTE — Progress Notes (Signed)
IUD Procedure Note   DIAGNOSIS: Desires long-term, reversible contraception   PROCEDURE: IUD placement Performing Provider: Orvilla Cornwallachelle Jacia Sickman CNM  Patient counseled prior to procedure. I explained risks and benefits of Mirena IUD, reviewed alternative forms of contraception. Patient stated understanding and consented to continue with procedure.   LMP: unknown, PP Pregnancy Test: Negative Lot #: [id number] Expiration Date: 02/2019   IUD type: [X]  Mirena   [  ] Paragard  [  ] Candise CheLyletta   [  ]  Kyleena  PROCEDURE:  Timeout procedure was performed to ensure right patient and right site.  A bimanual exam was performed to determine the position of the uterus, retroverted. The speculum was placed. The vagina and cervix was sterilized in the usual manner and sterile technique was maintained throughout the course of the procedure. A single toothed tenaculum was not required. The depth of the uterus was sounded to 12 cm. The IUD was inserted to the appropriate depth and inserted without difficulty.  The string was cut to an estimated 4 cm length. Bleeding was minimal. The patient tolerated the procedure well.   Follow up: The patient tolerated the procedure well without complications.  Standard post-procedure care is explained and return precautions are given.  Orvilla Cornwallachelle Darice Vicario CNM

## 2016-12-10 ENCOUNTER — Encounter: Payer: Self-pay | Admitting: Certified Nurse Midwife

## 2016-12-31 ENCOUNTER — Encounter: Payer: Self-pay | Admitting: Certified Nurse Midwife

## 2016-12-31 ENCOUNTER — Ambulatory Visit (INDEPENDENT_AMBULATORY_CARE_PROVIDER_SITE_OTHER): Payer: Medicaid Other | Admitting: Certified Nurse Midwife

## 2016-12-31 VITALS — BP 98/60 | HR 60 | Wt 142.0 lb

## 2016-12-31 DIAGNOSIS — Z30431 Encounter for routine checking of intrauterine contraceptive device: Secondary | ICD-10-CM | POA: Diagnosis not present

## 2016-12-31 NOTE — Progress Notes (Signed)
Subjective:    Lurline HareArlene Schlagel  who presents for contraception counseling. The patient has no complaints today. The patient is sexually active. Pertinent past medical history: none.  Likes her Mirena IUD.  Is not currently bleeding with the IUD.    The information documented in the HPI was reviewed and verified.  Menstrual History: OB History    Gravida Para Term Preterm AB Living   1 1 1     1    SAB TAB Ectopic Multiple Live Births         0 1      No LMP recorded. has Mirena IUD   Patient Active Problem List   Diagnosis Date Noted   Patient Active Problem List   Diagnosis Date Noted  . Susceptible to varicella (non-immune), currently pregnant 04/30/2016    No past medical history on file.  Past Surgical History:  Procedure Laterality Date  No past surgical history on file.    Current Outpatient Prescriptions:  No medication comments found. Current Outpatient Prescriptions on File Prior to Visit  Medication Sig Dispense Refill  . acetaminophen (TYLENOL) 325 MG tablet Take 650 mg by mouth every 6 (six) hours as needed for mild pain or headache.    . calcium carbonate (TUMS - DOSED IN MG ELEMENTAL CALCIUM) 500 MG chewable tablet Chew 1 tablet by mouth 2 (two) times daily as needed for indigestion or heartburn.     . Prenatal Vit-Fe Fumarate-FA (PRENATAL PLUS PO) Take 1 tablet by mouth at bedtime.      No current facility-administered medications on file prior to visit.     Allergies  Allergen Reactions  No Known Allergies   Social History  Substance Use Topics   Social History   Social History  . Marital status: Single    Spouse name: N/A  . Number of children: N/A  . Years of education: N/A   Occupational History  . Not on file.   Social History Main Topics  . Smoking status: Never Smoker  . Smokeless tobacco: Never Used  . Alcohol use No  . Drug use: No  . Sexual activity: Yes    Birth control/ protection: None   Other Topics Concern  . Not on file    Social History Narrative   ** Merged History Encounter **         No family history on file.     Review of Systems Constitutional: negative for weight loss Genitourinary:negative for abnormal menstrual periods and vaginal discharge   Objective:   BP 115/77   Pulse 89   Wt 161 lb (73 kg)   BMI 25.22 kg/m    General:   alert  Skin:   no rash or abnormalities  Lungs:   clear to auscultation bilaterally  Heart:   regular rate and rhythm, S1, S2 normal, no murmur, click, rub or gallop  Breasts:   deferred  Abdomen:  normal findings: no organomegaly, soft, non-tender and no hernia  Pelvis:  External genitalia: normal general appearance Urinary system: urethral meatus normal and bladder without fullness, nontender Vaginal: normal without tenderness, induration or masses Cervix: normal appearance, IUD strings present Adnexa: normal bimanual exam Uterus: anteverted and non-tender, normal size   Lab Review Urine pregnancy test Labs reviewed yes Radiologic studies reviewed no  50% of 15 min visit spent on counseling and coordination of care.    Assessment:    21 y.o., continuing IUD, no contraindications.   Plan:    All questions answered.  Follow up as needed or in 5 months for annual exam.

## 2016-12-31 NOTE — Progress Notes (Signed)
Pt states that she has heavy bleeding after having Liletta placed. Pt state that she is now having spotting.

## 2018-05-30 ENCOUNTER — Ambulatory Visit (INDEPENDENT_AMBULATORY_CARE_PROVIDER_SITE_OTHER): Payer: Medicaid Other | Admitting: Advanced Practice Midwife

## 2018-05-30 ENCOUNTER — Other Ambulatory Visit (HOSPITAL_COMMUNITY)
Admission: RE | Admit: 2018-05-30 | Discharge: 2018-05-30 | Disposition: A | Discharge status: Home / Self Care | Payer: Medicaid Other | Source: Ambulatory Visit | Attending: Advanced Practice Midwife | Admitting: Advanced Practice Midwife

## 2018-05-30 ENCOUNTER — Encounter: Payer: Self-pay | Admitting: Advanced Practice Midwife

## 2018-05-30 VITALS — BP 118/78 | HR 88 | Wt 161.4 lb

## 2018-05-30 DIAGNOSIS — Z30432 Encounter for removal of intrauterine contraceptive device: Secondary | ICD-10-CM | POA: Diagnosis not present

## 2018-05-30 DIAGNOSIS — Z01419 Encounter for gynecological examination (general) (routine) without abnormal findings: Secondary | ICD-10-CM | POA: Insufficient documentation

## 2018-05-30 DIAGNOSIS — Z3009 Encounter for other general counseling and advice on contraception: Secondary | ICD-10-CM

## 2018-05-30 DIAGNOSIS — Z0000 Encounter for general adult medical examination without abnormal findings: Secondary | ICD-10-CM

## 2018-05-30 NOTE — Progress Notes (Signed)
Pt presents for annual, pap, and STD testing. Pt request to remove Skyla d/t dyspareunia, mood swings, and random cramping. Pt unsure which BC; she will discuss with provider. Contraception option list given.

## 2018-05-30 NOTE — Progress Notes (Signed)
Subjective:     Morgan Hartman is a 23 y.o. female here for a routine exam.  She also desires removal of her Christean GriefSkyla IUD due to dyspareunia, cramping.  She is undecided about contraception after removal.  Pain with intercourse x last 3 months making her unable to have sex recently.  She desires STD testing today.   Current complaints: none.  Personal health questionnaire reviewed: yes.   Gynecologic History Patient's last menstrual period was 05/07/2018. Contraception: IUD Last Pap: 2018. Results were: normal   Obstetric History OB History  Gravida Para Term Preterm AB Living  2 1 1  0 1 1  SAB TAB Ectopic Multiple Live Births  1 0 0 0 1    # Outcome Date GA Lbr Len/2nd Weight Sex Delivery Anes PTL Lv  2 Term 10/29/16 7571w4d 03:54 / 00:56 2994 g F Vag-Spont None  LIV  1 SAB 12/2015 6321w0d            The following portions of the patient's history were reviewed and updated as appropriate: allergies, current medications, past family history, past medical history, past social history, past surgical history and problem list.  Review of Systems Pertinent items noted in HPI and remainder of comprehensive ROS otherwise negative.    Objective:   BP 118/78   Pulse 88   Wt 73.2 kg   LMP 05/07/2018   BMI 26.86 kg/m    VS reviewed, nursing note reviewed,  Constitutional: well developed, well nourished, no distress HEENT: normocephalic CV: normal rate Pulm/chest wall: normal effort Breast Exam:  right breast normal without mass, skin or nipple changes or axillary nodes, left breast normal without mass, skin or nipple changes or axillary nodes Abdomen: soft Neuro: alert and oriented x 3 Skin: warm, dry Psych: affect normal Pelvic exam: Cervix pink, visually closed, without lesion, IUD string visible ~ 4 cm in length from os, scant white creamy discharge, vaginal walls and external genitalia normal Bimanual exam: Cervix 0/long/high, firm, anterior, neg CMT, uterus nontender, nonenlarged,  adnexa without tenderness, enlargement, or mass  IUD Removal  Patient was in the dorsal lithotomy position, normal external genitalia was noted.  A speculum was placed in the patient's vagina, normal discharge was noted, no lesions. The multiparous cervix was visualized, no lesions, no abnormal discharge.  The strings of the IUD were grasped and pulled using ring forceps. The IUD was removed in its entirety. Patient tolerated the procedure well.    Patient will use condoms for contraception.  Routine preventative health maintenance measures emphasized.      Assessment/Plan:   1. Well woman exam with routine gynecological exam --doing well, no complaints other than pain with intercourse, which pt believes is IUD. --IUD appears normal on exam but cannot rule out minor malposition or IUD in lower uterine segment than desired.  Pt desires removal. - Cytology - PAP - Cervicovaginal ancillary only( [place]) - RPR - HIV Antibody (routine testing w rflx)  2. Encounter for IUD removal --IUD removed without difficulty. Pt tolerated well.  3. Encounter for counseling regarding contraception Discussed LARCs as most effective forms of birth control.  Discussed benefits/risks of other methods.  Pt desires condoms.  Discussed failure rates of condoms with regular use. Recommend pairing condoms with rhythm method/use of calendar or app and abstinence on most fertile days.  Pt to consider other contraceptive options later, printed materials given.   Contraception: condoms  Follow up in: 1 year or as needed.   Sharen CounterLisa Leftwich-Kirby, CNM 3:27  PM

## 2018-05-31 LAB — RPR: RPR Ser Ql: NONREACTIVE

## 2018-05-31 LAB — HIV ANTIBODY (ROUTINE TESTING W REFLEX): HIV SCREEN 4TH GENERATION: NONREACTIVE

## 2018-06-01 LAB — CERVICOVAGINAL ANCILLARY ONLY
CHLAMYDIA, DNA PROBE: NEGATIVE
NEISSERIA GONORRHEA: NEGATIVE
TRICH (WINDOWPATH): NEGATIVE

## 2018-06-01 LAB — CYTOLOGY - PAP
Adequacy: ABSENT
DIAGNOSIS: NEGATIVE

## 2018-08-11 IMAGING — US US RENAL
1 series · 15 of 25 positions shown · non-contrast
Comparison: CT Abdomen and Pelvis 10/23/2015

CLINICAL DATA: 21-year-old second trimester pregnant female with
right flank pain since last night. Prior nephrolithiasis.

EXAM:
RENAL / URINARY TRACT ULTRASOUND COMPLETE

[Series 1: us renal · 15 of 40 slices shown]
[im 1/40]
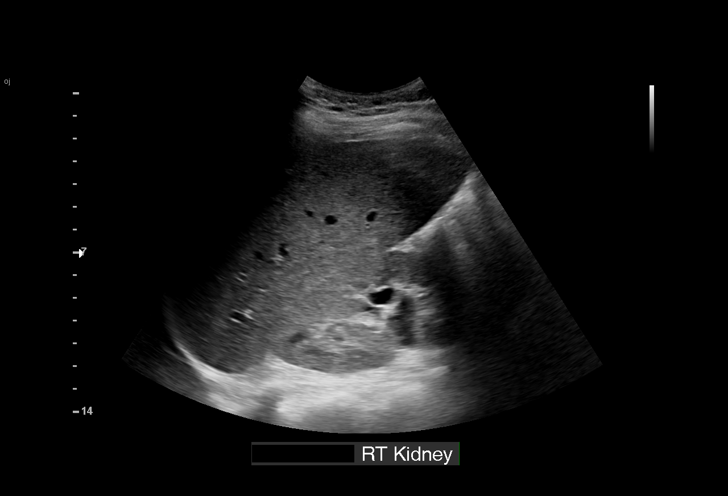
[im 4/40]
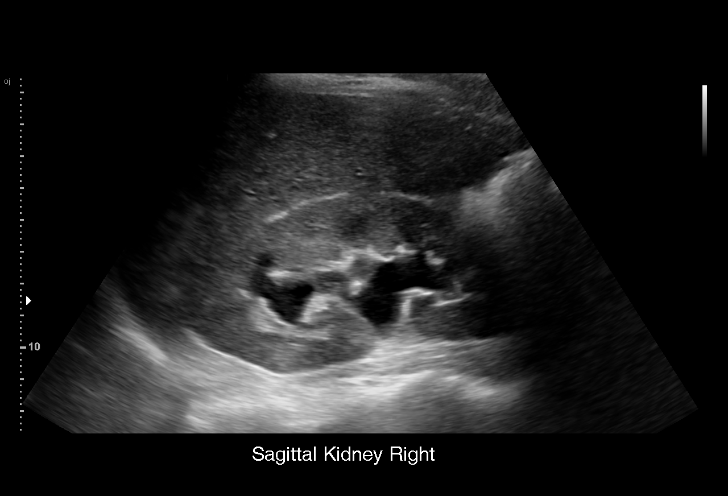
[im 7/40]
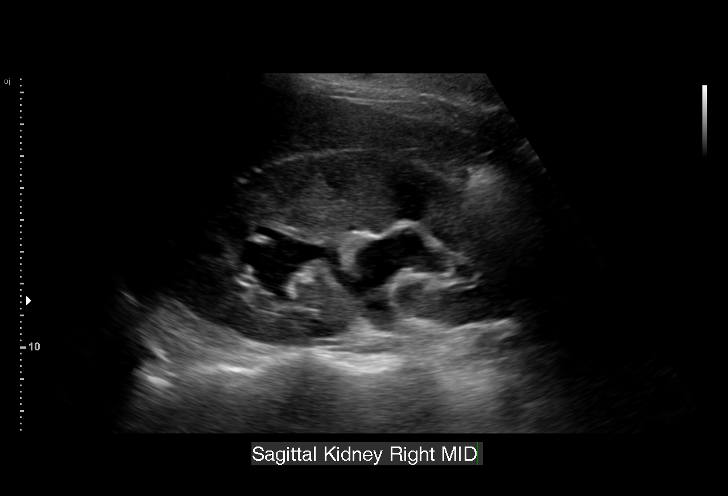
[im 9/40]
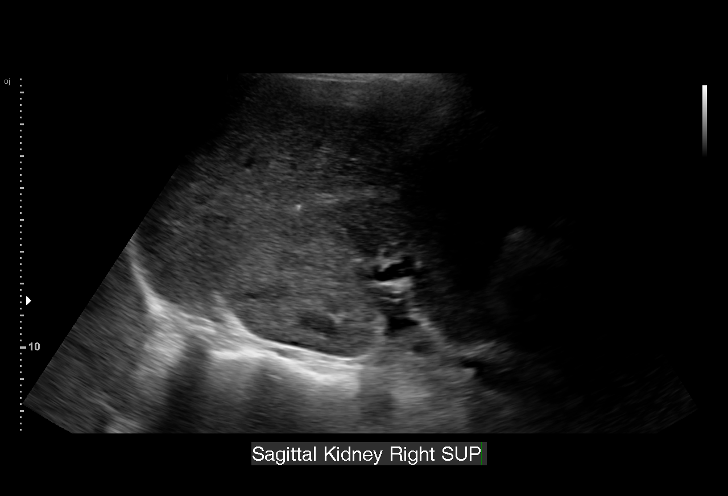
[im 12/40]
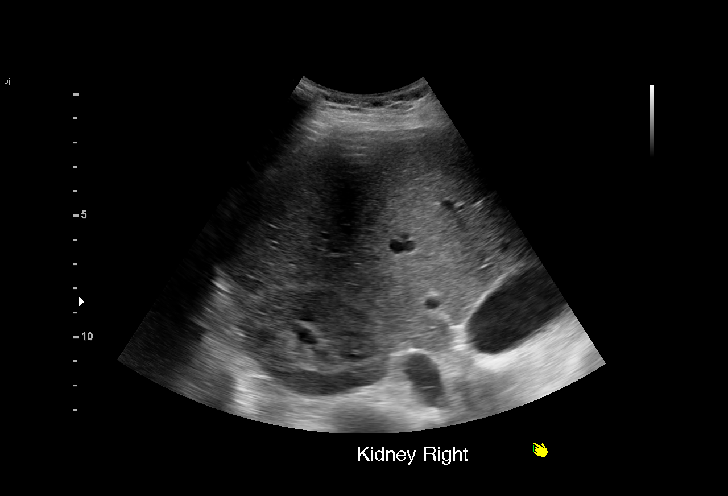
[im 15/40]
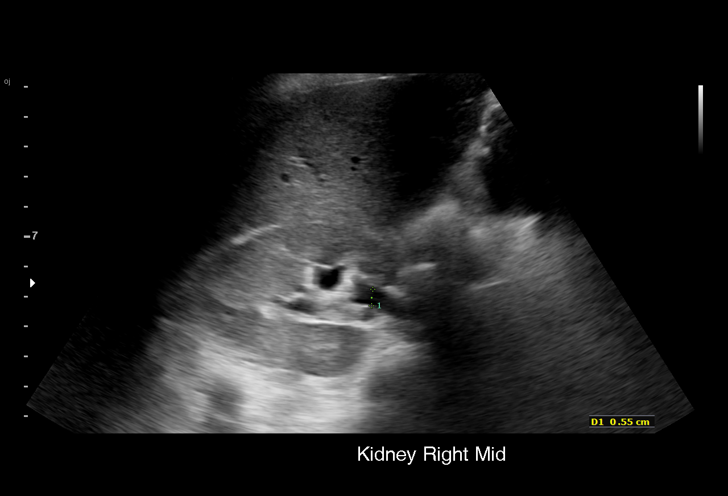
[im 17/40]
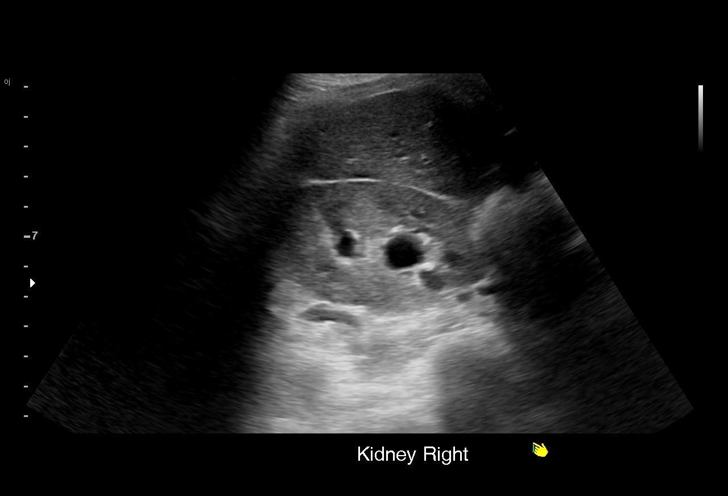
[im 20/40]
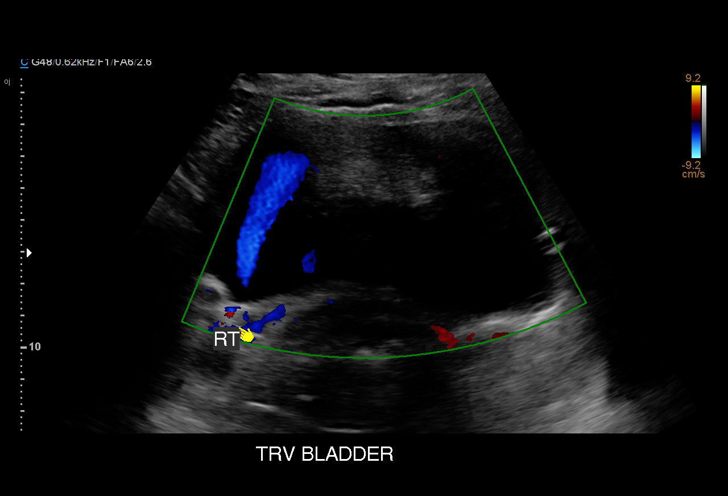
[im 23/40]
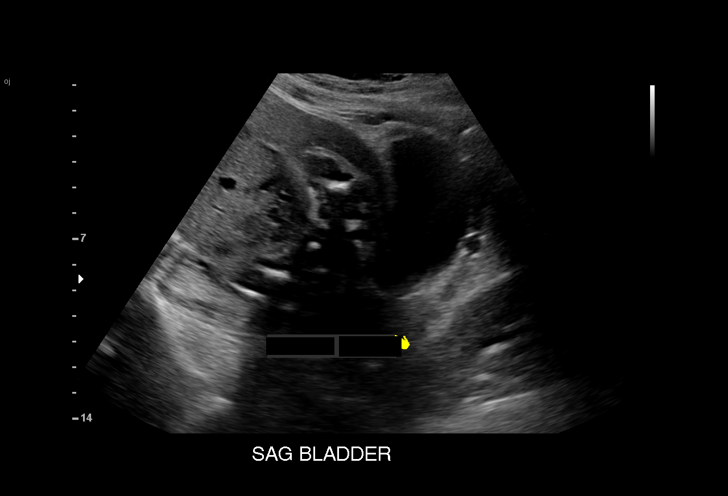
[im 25/40]
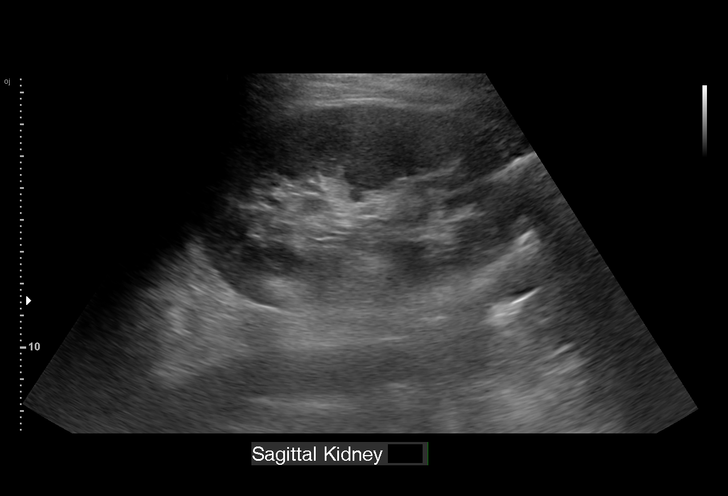
[im 28/40]
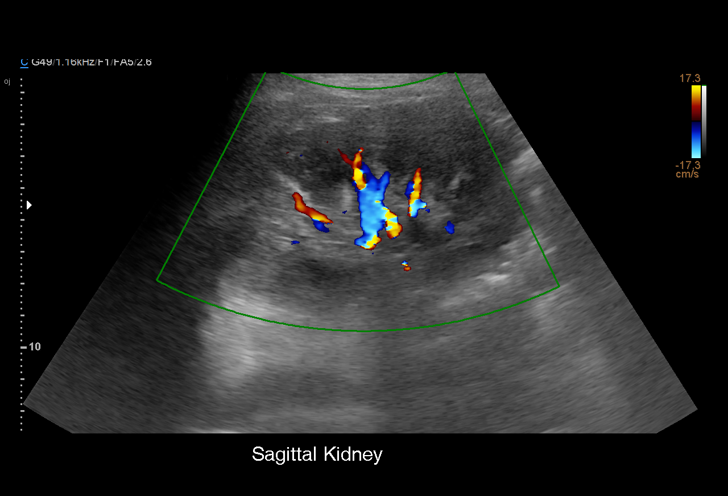
[im 31/40]
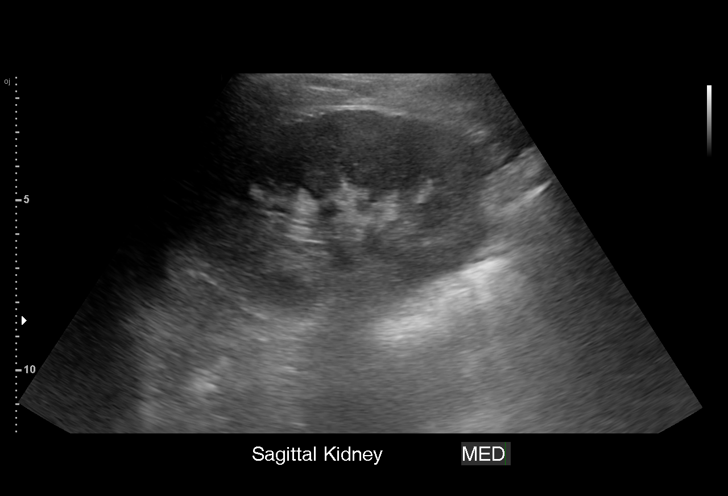
[im 33/40]
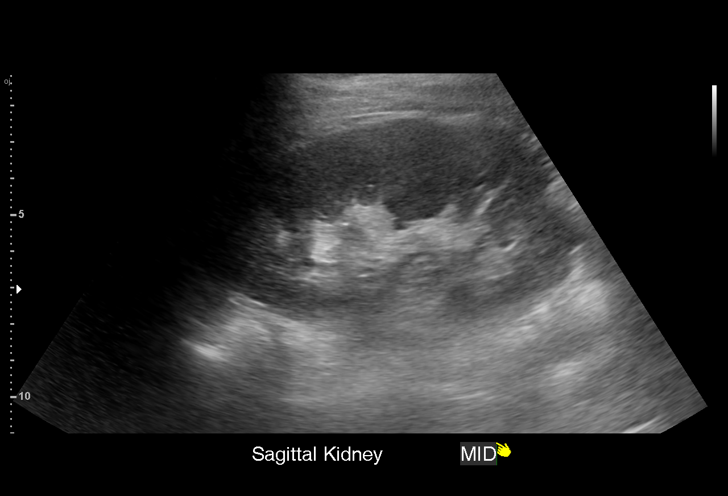
[im 36/40]
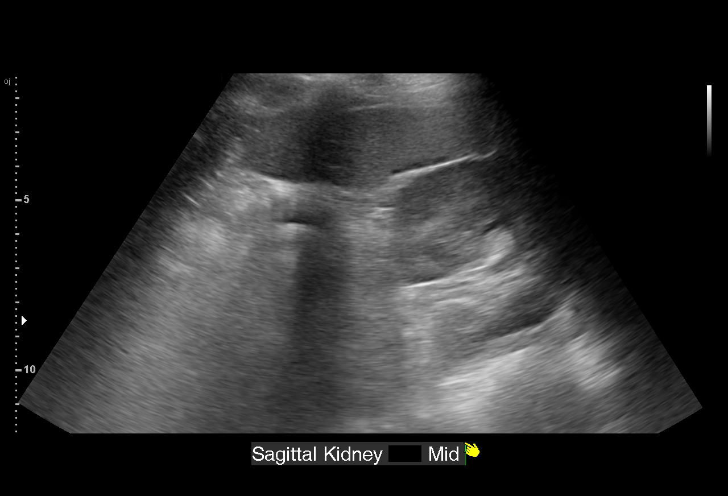
[im 40/40]
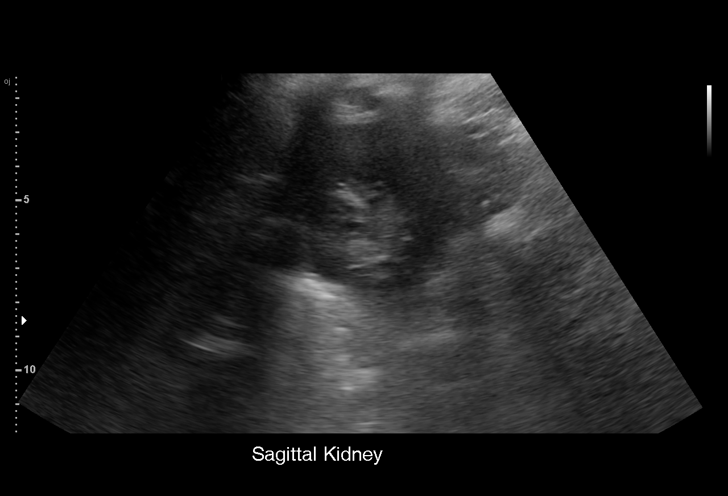

[15 of 25 positions shown; findings below may reference images not displayed]

FINDINGS: Right Kidney:

Length: 11.1 cm. Corticomedullary differentiation and cortical
echotexture within normal limits. Mild to moderate right
hydronephrosis. No intrarenal calculus is evident by ultrasound.

Left Kidney:

Length: 10.2 cm. Echogenicity within normal limits. No mass or
hydronephrosis visualized.

Bladder:

Appears normal for degree of bladder distention. Both ureteral jets
were detected with Doppler (image 24), although the left side
appears more robust.
IMPRESSION: 1. Positive for mild to moderate right hydronephrosis. Differential
considerations include maternal hydronephrosis of pregnancy and
obstructive uropathy due to recurrent urologic calculus.
2. Normal left kidney.  Unremarkable urinary bladder.

## 2019-01-09 ENCOUNTER — Ambulatory Visit (INDEPENDENT_AMBULATORY_CARE_PROVIDER_SITE_OTHER): Payer: Medicaid Other | Admitting: *Deleted

## 2019-01-09 ENCOUNTER — Other Ambulatory Visit: Payer: Self-pay

## 2019-01-09 ENCOUNTER — Encounter: Payer: Self-pay | Admitting: *Deleted

## 2019-01-09 DIAGNOSIS — Z3201 Encounter for pregnancy test, result positive: Secondary | ICD-10-CM | POA: Diagnosis not present

## 2019-01-09 DIAGNOSIS — Z3200 Encounter for pregnancy test, result unknown: Secondary | ICD-10-CM

## 2019-01-09 NOTE — Progress Notes (Signed)
Morgan Hartman presents today for UPT. She has no unusual complaints. LMP:11/20/2018    OBJECTIVE: Appears well, in no apparent distress.  OB History    Gravida  3   Para  1   Term  1   Preterm  0   AB  1   Living  1     SAB  1   TAB  0   Ectopic  0   Multiple  0   Live Births  1          Home UPT Result:Positive In-Office UPT result:Positive   ASSESSMENT: Positive pregnancy test  PLAN Prenatal care to be completed at: [facility]

## 2019-01-10 NOTE — Progress Notes (Signed)
I have reviewed this chart and agree with the RN/CMA assessment and management.    K. Meryl Lyrik Buresh, M.D. Attending [facility] (Faculty Practice)   

## 2019-01-21 ENCOUNTER — Inpatient Hospital Stay (HOSPITAL_COMMUNITY)
Admission: AD | Admit: 2019-01-21 | Discharge: 2019-01-21 | Disposition: A | Discharge status: Home / Self Care | Payer: Medicaid Other | Attending: Obstetrics & Gynecology | Admitting: Obstetrics & Gynecology

## 2019-01-21 ENCOUNTER — Inpatient Hospital Stay (HOSPITAL_COMMUNITY): Payer: Medicaid Other

## 2019-01-21 ENCOUNTER — Other Ambulatory Visit: Payer: Self-pay

## 2019-01-21 ENCOUNTER — Encounter (HOSPITAL_COMMUNITY): Payer: Self-pay

## 2019-01-21 DIAGNOSIS — O209 Hemorrhage in early pregnancy, unspecified: Secondary | ICD-10-CM

## 2019-01-21 DIAGNOSIS — Z3491 Encounter for supervision of normal pregnancy, unspecified, first trimester: Secondary | ICD-10-CM

## 2019-01-21 DIAGNOSIS — O208 Other hemorrhage in early pregnancy: Secondary | ICD-10-CM | POA: Diagnosis not present

## 2019-01-21 DIAGNOSIS — B3731 Acute candidiasis of vulva and vagina: Secondary | ICD-10-CM

## 2019-01-21 DIAGNOSIS — B373 Candidiasis of vulva and vagina: Secondary | ICD-10-CM | POA: Insufficient documentation

## 2019-01-21 DIAGNOSIS — O26891 Other specified pregnancy related conditions, first trimester: Secondary | ICD-10-CM | POA: Diagnosis present

## 2019-01-21 DIAGNOSIS — O98811 Other maternal infectious and parasitic diseases complicating pregnancy, first trimester: Secondary | ICD-10-CM | POA: Diagnosis not present

## 2019-01-21 DIAGNOSIS — R109 Unspecified abdominal pain: Secondary | ICD-10-CM | POA: Insufficient documentation

## 2019-01-21 DIAGNOSIS — O418X10 Other specified disorders of amniotic fluid and membranes, first trimester, not applicable or unspecified: Secondary | ICD-10-CM | POA: Diagnosis not present

## 2019-01-21 DIAGNOSIS — O468X1 Other antepartum hemorrhage, first trimester: Secondary | ICD-10-CM | POA: Diagnosis not present

## 2019-01-21 DIAGNOSIS — Z3A08 8 weeks gestation of pregnancy: Secondary | ICD-10-CM | POA: Diagnosis not present

## 2019-01-21 LAB — URINALYSIS, ROUTINE W REFLEX MICROSCOPIC
Bilirubin Urine: NEGATIVE
Glucose, UA: NEGATIVE mg/dL
Hgb urine dipstick: NEGATIVE
Ketones, ur: NEGATIVE mg/dL
Leukocytes,Ua: NEGATIVE
Nitrite: NEGATIVE
Protein, ur: 30 mg/dL — AB
Specific Gravity, Urine: 1.02 (ref 1.005–1.030)
pH: 8 (ref 5.0–8.0)

## 2019-01-21 LAB — CBC
HCT: 34.4 % — ABNORMAL LOW (ref 36.0–46.0)
Hemoglobin: 11.9 g/dL — ABNORMAL LOW (ref 12.0–15.0)
MCH: 30.3 pg (ref 26.0–34.0)
MCHC: 34.6 g/dL (ref 30.0–36.0)
MCV: 87.5 fL (ref 80.0–100.0)
Platelets: 312 10*3/uL (ref 150–400)
RBC: 3.93 MIL/uL (ref 3.87–5.11)
RDW: 12.7 % (ref 11.5–15.5)
WBC: 6.4 10*3/uL (ref 4.0–10.5)
nRBC: 0 % (ref 0.0–0.2)

## 2019-01-21 LAB — WET PREP, GENITAL
Clue Cells Wet Prep HPF POC: NONE SEEN
Sperm: NONE SEEN
Trich, Wet Prep: NONE SEEN

## 2019-01-21 LAB — HCG, QUANTITATIVE, PREGNANCY: hCG, Beta Chain, Quant, S: 145319 m[IU]/mL — ABNORMAL HIGH (ref <5)

## 2019-01-21 MED ORDER — TERCONAZOLE 0.8 % VA CREA: 1.0000 | TOPICAL_CREAM | Freq: Every day | VAGINAL | 0 refills | Status: DC

## 2019-01-21 NOTE — Discharge Instructions (Signed)
Subchorionic Hematoma ° °A subchorionic hematoma is a gathering of blood between the outer wall of the embryo (chorion) and the inner wall of the womb (uterus). °This condition can cause vaginal bleeding. If they cause little or no vaginal bleeding, early small hematomas usually shrink on their own and do not affect your baby or pregnancy. When bleeding starts later in pregnancy, or if the hematoma is larger or occurs in older pregnant women, the condition may be more serious. Larger hematomas may get bigger, which increases the chances of miscarriage. This condition also increases the risk of: °· Premature separation of the placenta from the uterus. °· Premature (preterm) labor. °· Stillbirth. °What are the causes? °The exact cause of this condition is not known. It occurs when blood is trapped between the placenta and the uterine wall because the placenta has separated from the original site of implantation. °What increases the risk? °You are more likely to develop this condition if: °· You were treated with fertility medicines. °· You conceived through in vitro fertilization (IVF). °What are the signs or symptoms? °Symptoms of this condition include: °· Vaginal spotting or bleeding. °· Contractions of the uterus. These cause abdominal pain. °Sometimes you may have no symptoms and the bleeding may only be seen when ultrasound images are taken (transvaginal ultrasound). °How is this diagnosed? °This condition is diagnosed based on a physical exam. This includes a pelvic exam. You may also have other tests, including: °· Blood tests. °· Urine tests. °· Ultrasound of the abdomen. °How is this treated? °Treatment for this condition can vary. Treatment may include: °· Watchful waiting. You will be monitored closely for any changes in bleeding. During this stage: °? The hematoma may be reabsorbed by the body. °? The hematoma may separate the fluid-filled space containing the embryo (gestational sac) from the wall of the  womb (endometrium). °· Medicines. °· Activity restriction. This may be needed until the bleeding stops. °Follow these instructions at home: °· Stay on bed rest if told to do so by your health care provider. °· Do not lift anything that is heavier than 10 lbs. (4.5 kg) or as told by your health care provider. °· Do not use any products that contain nicotine or tobacco, such as cigarettes and e-cigarettes. If you need help quitting, ask your health care provider. °· Track and write down the number of pads you use each day and how soaked (saturated) they are. °· Do not use tampons. °· Keep all follow-up visits as told by your health care provider. This is important. Your health care provider may ask you to have follow-up blood tests or ultrasound tests or both. °Contact a health care provider if: °· You have any vaginal bleeding. °· You have a fever. °Get help right away if: °· You have severe cramps in your stomach, back, abdomen, or pelvis. °· You pass large clots or tissue. Save any tissue for your health care provider to look at. °· You have more vaginal bleeding, and you faint or become lightheaded or weak. °Summary °· A subchorionic hematoma is a gathering of blood between the outer wall of the placenta and the uterus. °· This condition can cause vaginal bleeding. °· Sometimes you may have no symptoms and the bleeding may only be seen when ultrasound images are taken. °· Treatment may include watchful waiting, medicines, or activity restriction. °This information is not intended to replace advice given to you by your health care provider. Make sure you discuss any questions you   have with your health care provider. °Document Released: 07/29/2006 Document Revised: 03/26/2017 Document Reviewed: 06/09/2016 °Elsevier Patient Education © 2020 [employer] Inc[employer] ° °

## 2019-01-21 NOTE — MAU Note (Signed)
Morgan Hartman is a 23 y.o. at [redacted]w[redacted]d here in MAU reporting: woke up this AM with bleeding, states it was not enough to soak a pad. Also having some cramping, states now it is a little bit more painful. No abnormal discharge. No recent IC. Has been to the office for pregnancy confirmation.  Onset of complaint: today  Pain score: 3/10  Vitals:   01/21/19 1310  BP: 126/74  Pulse: 78  Resp: 16  Temp: 98.1 F (36.7 C)  SpO2: 100%      Lab orders placed from triage: UA

## 2019-01-21 NOTE — MAU Provider Note (Signed)
Chief Complaint: Abdominal Pain and Vaginal Bleeding   First Provider Initiated Contact with Patient 01/21/19 1346     SUBJECTIVE HPI: Morgan Hartman is a 23 y.o. G3P1011 at 5925w6d who presents to Maternity Admissions reporting vaginal bleeding & abdominal cramping. Symptoms started this morning. Reports pink spotting on toilet paper. No blood when she used the bathroom in MAU. Has not saturated a pad or passed clots. Abdominal cramping started the same time. Denies n/v/d, constipation, dysuria, vaginal discharge, or recent intercourse.   Location: abdomen Quality: cramping Severity: 3/10 on pain scale Duration: <1 day Timing: intermittent Modifying factors: none Associated signs and symptoms: vaginal bleeding  Past Medical History:  Diagnosis Date  . Kidney calculus 2017   OB History  Gravida Para Term Preterm AB Living  3 1 1  0 1 1  SAB TAB Ectopic Multiple Live Births  1 0 0 0 1    # Outcome Date GA Lbr Len/2nd Weight Sex Delivery Anes PTL Lv  3 Current           2 Term 10/29/16 7147w4d 03:54 / 00:56 2994 g F Vag-Spont None  LIV  1 SAB 12/2015 6824w0d          History reviewed. No pertinent surgical history. Social History   Socioeconomic History  . Marital status: Single    Spouse name: Not on file  . Number of children: Not on file  . Years of education: Not on file  . Highest education level: Not on file  Occupational History  . Not on file  Social Needs  . Financial resource strain: Not on file  . Food insecurity    Worry: Not on file    Inability: Not on file  . Transportation needs    Medical: Not on file    Non-medical: Not on file  Tobacco Use  . Smoking status: Never Smoker  . Smokeless tobacco: Never Used  Substance and Sexual Activity  . Alcohol use: Yes    Comment: occ.  . Drug use: No  . Sexual activity: Yes  Lifestyle  . Physical activity    Days per week: Not on file    Minutes per session: Not on file  . Stress: Not on file  Relationships  .  Social Musicianconnections    Talks on phone: Not on file    Gets together: Not on file    Attends religious service: Not on file    Active member of club or organization: Not on file    Attends meetings of clubs or organizations: Not on file    Relationship status: Not on file  . Intimate partner violence    Fear of current or ex partner: Not on file    Emotionally abused: Not on file    Physically abused: Not on file    Forced sexual activity: Not on file  Other Topics Concern  . Not on file  Social History Narrative   ** Merged History Encounter **       Family History  Problem Relation Age of Onset  . Diabetes Maternal Grandmother   . Depression Maternal Grandfather   . Diabetes Paternal Grandmother   . Heart attack Paternal Grandmother   . Stroke Paternal Grandfather    No current facility-administered medications on file prior to encounter.    Current Outpatient Medications on File Prior to Encounter  Medication Sig Dispense Refill  . calcium carbonate (TUMS - DOSED IN MG ELEMENTAL CALCIUM) 500 MG chewable tablet Chew 1 tablet  by mouth 2 (two) times daily as needed for indigestion or heartburn.     . Prenatal Vit-Fe Fumarate-FA (PRENATAL PLUS PO) Take 1 tablet by mouth at bedtime.      No Known Allergies  I have reviewed patient's Past Medical Hx, Surgical Hx, Family Hx, Social Hx, medications and allergies.   Review of Systems  Constitutional: Negative.   Gastrointestinal: Positive for abdominal pain. Negative for constipation, diarrhea, nausea and vomiting.  Genitourinary: Positive for vaginal bleeding. Negative for dysuria and vaginal discharge.    OBJECTIVE Patient Vitals for the past 24 hrs:  BP Temp Temp src Pulse Resp SpO2 Height Weight  01/21/19 1605 124/68 - - 74 - - - -  01/21/19 1310 126/74 98.1 F (36.7 C) Oral 78 16 100 % - -  01/21/19 1256 - - - - - - 5\' 5"  (1.651 m) 70.8 kg   Constitutional: Well-developed, well-nourished female in no acute distress.   Cardiovascular: normal rate & rhythm, no murmur Respiratory: normal rate and effort. Lung sounds clear throughout GI: Abd soft, non-tender, Pos BS x 4. No guarding or rebound tenderness MS: Extremities nontender, no edema, normal ROM Neurologic: Alert and oriented x 4.  GU:     SPECULUM EXAM: NEFG, small amount of brown discharge. No active bleeding.   BIMANUAL: No CMT. cervix closed; uterus normal size, no adnexal tenderness or masses.    LAB RESULTS Results for orders placed or performed during the hospital encounter of 01/21/19 (from the past 24 hour(s))  Urinalysis, Routine w reflex microscopic     Status: Abnormal   Collection Time: 01/21/19  1:38 PM  Result Value Ref Range   Color, Urine YELLOW YELLOW   APPearance CLOUDY (A) CLEAR   Specific Gravity, Urine 1.020 1.005 - 1.030   pH 8.0 5.0 - 8.0   Glucose, UA NEGATIVE NEGATIVE mg/dL   Hgb urine dipstick NEGATIVE NEGATIVE   Bilirubin Urine NEGATIVE NEGATIVE   Ketones, ur NEGATIVE NEGATIVE mg/dL   Protein, ur 30 (A) NEGATIVE mg/dL   Nitrite NEGATIVE NEGATIVE   Leukocytes,Ua NEGATIVE NEGATIVE   RBC / HPF 6-10 0 - 5 RBC/hpf   WBC, UA 0-5 0 - 5 WBC/hpf   Bacteria, UA FEW (A) NONE SEEN   Squamous Epithelial / LPF 0-5 0 - 5   Mucus PRESENT    Amorphous Crystal PRESENT   Wet prep, genital     Status: Abnormal   Collection Time: 01/21/19  2:06 PM   Specimen: PATH Cytology Cervicovaginal Ancillary Only  Result Value Ref Range   Yeast Wet Prep HPF POC PRESENT (A) NONE SEEN   Trich, Wet Prep NONE SEEN NONE SEEN   Clue Cells Wet Prep HPF POC NONE SEEN NONE SEEN   WBC, Wet Prep HPF POC MANY (A) NONE SEEN   Sperm NONE SEEN   CBC     Status: Abnormal   Collection Time: 01/21/19  2:12 PM  Result Value Ref Range   WBC 6.4 4.0 - 10.5 K/uL   RBC 3.93 3.87 - 5.11 MIL/uL   Hemoglobin 11.9 (L) 12.0 - 15.0 g/dL   HCT 01/23/19 (L) 35.7 - 01.7 %   MCV 87.5 80.0 - 100.0 fL   MCH 30.3 26.0 - 34.0 pg   MCHC 34.6 30.0 - 36.0 g/dL   RDW 79.3  90.3 - 00.9 %   Platelets 312 150 - 400 K/uL   nRBC 0.0 0.0 - 0.2 %  hCG, quantitative, pregnancy     Status: Abnormal  Collection Time: 01/21/19  2:12 PM  Result Value Ref Range   hCG, Beta Chain, Quant, S 145,319 (H) <5 mIU/mL    IMAGING US Ob Less Than 14 Weeks With Ob Transvaginal  Result Date: 01/21/2019 CLINICAL DATA:  Abdominal pain with bleeding EXAM: OBSTETRIC <14 WK [place] AND TRANSVAGINAL OB US TECHNIQUE: Both transabdominal and transvaginal ultrasound examinations were performed for complete evaluation of the gestation as well as the maternal uterus, adnexal regions, and pelvic cul-de-sac. Transvaginal technique was performed to assess early pregnancy. COMPARISON:  None. FINDINGS: Intrauterine gestational sac: Single Yolk sac:  Visualized. Embryo:  Visualized. Cardiac Activity: Visualized. Heart Rate: 168 bpm CRL: 19 mm   8 w   2 d                  [place] EDC: 08/31/2019 Subchorionic hemorrhage: Small subchorionic hemorrhage along the right posterior sac. Maternal uterus/adnexae: Ovaries are within normal limits. Right ovary measures 4.2 x 2.6 x 3.2 cm with a volume of 18 mL. Left ovary measures 2.6 x 1.4 x 2 cm with a volume of 3.7 mL. No significant free fluid IMPRESSION: 1. Single viable intrauterine pregnancy as above. 2. Small subchorionic hemorrhage. Electronically Signed   By: Donavan Foil M.D.   On: 01/21/2019 15:43    MAU COURSE Orders Placed This Encounter  Procedures  . Wet prep, genital  . US OB LESS THAN 14 WEEKS WITH OB TRANSVAGINAL  . Urinalysis, Routine w reflex microscopic  . CBC  . hCG, quantitative, pregnancy  . Discharge patient   Meds ordered this encounter  Medications  . terconazole (TERAZOL 3) 0.8 % vaginal cream    Sig: Place 1 applicator vaginally at bedtime.    Dispense:  20 g    Refill:  0    Order Specific Question:   Supervising Provider    Answer:   Verita Schneiders A [[id number]]    MDM +UPT UA, wet prep, GC/chlamydia, CBC, ABO/Rh, quant hCG, and [place]  today to rule out ectopic pregnancy  RH positive  Wet prep + yeast  Ultrasound shows live IUP & small [facility]  ASSESSMENT 1. Subchorionic hematoma in first trimester, single or unspecified fetus   2. Vaginal bleeding in pregnancy, first trimester   3. Abdominal pain during pregnancy in first trimester   4. Normal IUP (intrauterine pregnancy) on prenatal ultrasound, first trimester   5. Vaginal yeast infection     PLAN Discharge home in stable condition. Bleeding precautions Rx terazol GC/CT pending Start prenatal care  Follow-up Information    [facility] Follow up.   Specialty: Obstetrics and Gynecology Why: return for worsening symptoms Contact information: [address] [id number] [place] [phone]         Allergies as of 01/21/2019   No Known Allergies     Medication List    TAKE these medications   calcium carbonate 500 MG chewable tablet Commonly known as: TUMS - dosed in mg elemental calcium Chew 1 tablet by mouth 2 (two) times daily as needed for indigestion or heartburn.   PRENATAL PLUS PO Take 1 tablet by mouth at bedtime.   terconazole 0.8 % vaginal cream Commonly known as: Terazol 3 Place 1 applicator vaginally at bedtime.        Jorje Guild, NP 01/21/2019  5:31 PM

## 2019-01-23 LAB — CERVICOVAGINAL ANCILLARY ONLY
Chlamydia: NEGATIVE
Neisseria Gonorrhea: NEGATIVE

## 2019-01-25 ENCOUNTER — Telehealth: Payer: Self-pay

## 2019-01-25 NOTE — Telephone Encounter (Signed)
Pt called stating that she has not been able to keep any foods or fluids down for 4 days. She has tried small snacks, sips of water and ginger ale. Pt advised to go to MAU for evaluation

## 2019-01-27 ENCOUNTER — Inpatient Hospital Stay (HOSPITAL_COMMUNITY)
Admission: AD | Admit: 2019-01-27 | Discharge: 2019-01-27 | Disposition: A | Discharge status: Home / Self Care | Payer: Medicaid Other | Attending: Obstetrics & Gynecology | Admitting: Obstetrics & Gynecology

## 2019-01-27 ENCOUNTER — Other Ambulatory Visit: Payer: Self-pay

## 2019-01-27 ENCOUNTER — Encounter (HOSPITAL_COMMUNITY): Payer: Self-pay | Admitting: *Deleted

## 2019-01-27 DIAGNOSIS — Z3A09 9 weeks gestation of pregnancy: Secondary | ICD-10-CM | POA: Diagnosis not present

## 2019-01-27 DIAGNOSIS — O210 Mild hyperemesis gravidarum: Secondary | ICD-10-CM | POA: Diagnosis present

## 2019-01-27 LAB — URINALYSIS, ROUTINE W REFLEX MICROSCOPIC
Bacteria, UA: NONE SEEN
Bilirubin Urine: NEGATIVE
Glucose, UA: NEGATIVE mg/dL
Hgb urine dipstick: NEGATIVE
Ketones, ur: 80 mg/dL — AB
Leukocytes,Ua: NEGATIVE
Nitrite: NEGATIVE
Protein, ur: 30 mg/dL — AB
Specific Gravity, Urine: 1.031 — ABNORMAL HIGH (ref 1.005–1.030)
pH: 5 (ref 5.0–8.0)

## 2019-01-27 MED ORDER — PROMETHAZINE HCL 25 MG PO TABS: 25.0000 mg | ORAL_TABLET | Freq: Four times a day (QID) | ORAL | 3 refills | Status: DC | PRN

## 2019-01-27 MED ORDER — SCOPOLAMINE 1 MG/3DAYS TD PT72: 1.0000 | MEDICATED_PATCH | TRANSDERMAL | 0 refills | Status: DC

## 2019-01-27 MED ORDER — LACTATED RINGERS IV BOLUS
1000.0000 mL | Freq: Once | INTRAVENOUS | Status: AC
  Administered 2019-01-27: 1000 mL via INTRAVENOUS

## 2019-01-27 MED ORDER — M.V.I. ADULT IV INJ
Freq: Once | INTRAVENOUS | Status: AC
  Administered 2019-01-27: 15:00:00 via INTRAVENOUS
  Filled 2019-01-27: qty 1000

## 2019-01-27 MED ORDER — ONDANSETRON 8 MG PO TBDP: 8.0000 mg | ORAL_TABLET | Freq: Three times a day (TID) | ORAL | 1 refills | Status: DC | PRN

## 2019-01-27 MED ORDER — SODIUM CHLORIDE 0.9 % IV SOLN
25.0000 mg | Freq: Once | INTRAVENOUS | Status: AC
  Administered 2019-01-27: 25 mg via INTRAVENOUS
  Filled 2019-01-27: qty 1

## 2019-01-27 MED ORDER — FAMOTIDINE 20 MG PO TABS: 20.0000 mg | ORAL_TABLET | Freq: Two times a day (BID) | ORAL | 3 refills | Status: DC

## 2019-01-27 NOTE — MAU Note (Signed)
.   Morgan Hartman is a 23 y.o. at [redacted]w[redacted]d here in MAU reporting: vomiting all week and not able to keep much down. Pt states she called the office and was told to come here for nausea meds. Denies any pain or VB   LMP: 11/20/18 Onset of complaint: ongoing for a week Pain score: 0 Vitals:   01/27/19 1225  BP: 127/80  Pulse: 73  Resp: 16  Temp: 98 F (36.7 C)     FHT: Lab orders placed from triage: UA

## 2019-01-27 NOTE — MAU Note (Signed)
Pt tolerated crackers and spirte

## 2019-01-27 NOTE — MAU Provider Note (Signed)
History     CSN: [id number]  Arrival date and time: 01/27/19 1154   First Provider Initiated Contact with Patient 01/27/19 1344      Chief Complaint  Patient presents with  . Emesis   HPI  Ms.  Morgan Hartman is a 23 y.o. year old G45P1011 female at [redacted]w[redacted]d weeks gestation who presents to MAU reporting vomiting all week and not able to keep much. She states that she called [facility] and was told to go to MAU for nausea meds. She denies pain/cramping or VB. She reports not taken any medications nor has she been Rx'd any medications for N/V.  Past Medical History:  Diagnosis Date  . Kidney calculus 2017    History reviewed. No pertinent surgical history.  Family History  Problem Relation Age of Onset  . Diabetes Maternal Grandmother   . Depression Maternal Grandfather   . Diabetes Paternal Grandmother   . Heart attack Paternal Grandmother   . Stroke Paternal Grandfather     Social History   Tobacco Use  . Smoking status: Never Smoker  . Smokeless tobacco: Never Used  Substance Use Topics  . Alcohol use: Yes    Comment: occ.  . Drug use: No    Allergies: No Known Allergies  Medications Prior to Admission  Medication Sig Dispense Refill Last Dose  . calcium carbonate (TUMS - DOSED IN MG ELEMENTAL CALCIUM) 500 MG chewable tablet Chew 1 tablet by mouth 2 (two) times daily as needed for indigestion or heartburn.    01/27/2019 at Unknown time  . Prenatal Vit-Fe Fumarate-FA (PRENATAL PLUS PO) Take 1 tablet by mouth at bedtime.    01/27/2019 at Unknown time  . terconazole (TERAZOL 3) 0.8 % vaginal cream Place 1 applicator vaginally at bedtime. 20 g 0 01/27/2019 at Unknown time    Review of Systems  Constitutional: Negative.   HENT: Negative.   Eyes: Negative.   Respiratory: Negative.   Cardiovascular: Negative.   Gastrointestinal: Positive for nausea and vomiting.  Endocrine: Negative.   Genitourinary: Negative.   Musculoskeletal: Negative.   Skin: Negative.    Allergic/Immunologic: Negative.   Neurological: Negative.   Hematological: Negative.   Psychiatric/Behavioral: Negative.    Physical Exam   Blood pressure 127/80, pulse 73, temperature 98 F (36.7 C), resp. rate 16, last menstrual period 11/20/2018.  Physical Exam  Nursing note and vitals reviewed. Constitutional: She is oriented to person, place, and time. She appears well-developed and well-nourished.  HENT:  Head: Normocephalic and atraumatic.  Eyes: Pupils are equal, round, and reactive to light.  Neck: Normal range of motion.  Cardiovascular: Normal rate.  Respiratory: Effort normal.  GI: Soft.  Genitourinary:    Genitourinary Comments: Pelvic deferred   Musculoskeletal: Normal range of motion.  Neurological: She is alert and oriented to person, place, and time.  Skin: Skin is warm and dry.  Psychiatric: She has a normal mood and affect. Her behavior is normal. Judgment and thought content normal.    MAU Course  Procedures  MDM IVFs: Phenergan 25 mg in LR 1000 ml @ 999 ml/hr; followed by MVI in LR 1000 ml @ 500 ml/hr -- resolved nausea/vomiting PO Challenge -- patient tolerated well  Results for orders placed or performed during the hospital encounter of 01/27/19 (from the past 24 hour(s))  Urinalysis, Routine w reflex microscopic     Status: Abnormal   Collection Time: 01/27/19 12:06 PM  Result Value Ref Range   Color, Urine AMBER (A) YELLOW   APPearance HAZY (  A) CLEAR   Specific Gravity, Urine 1.031 (H) 1.005 - 1.030   pH 5.0 5.0 - 8.0   Glucose, UA NEGATIVE NEGATIVE mg/dL   Hgb urine dipstick NEGATIVE NEGATIVE   Bilirubin Urine NEGATIVE NEGATIVE   Ketones, ur 80 (A) NEGATIVE mg/dL   Protein, ur 30 (A) NEGATIVE mg/dL   Nitrite NEGATIVE NEGATIVE   Leukocytes,Ua NEGATIVE NEGATIVE   RBC / HPF 0-5 0 - 5 RBC/hpf   WBC, UA 0-5 0 - 5 WBC/hpf   Bacteria, UA NONE SEEN NONE SEEN   Squamous Epithelial / LPF 0-5 0 - 5   Mucus PRESENT     Assessment and Plan   Morning sickness - Plan: Discharge patient - Rx'd Phenergan 25 mg, Zofran 8 mg ODT, Scopolamine patches, and Pepcid 20 mg BID - Information provided on morning sickness - Discharge home - Keep scheduled appt with [facility] on 02/06/2019 - Patient verbalized an understanding of the plan of care and agrees.    Laury Deep, MSN, CNM 01/27/2019, 1:44 PM

## 2019-02-03 DIAGNOSIS — Z3480 Encounter for supervision of other normal pregnancy, unspecified trimester: Secondary | ICD-10-CM | POA: Insufficient documentation

## 2019-02-06 ENCOUNTER — Other Ambulatory Visit: Payer: Self-pay

## 2019-02-06 ENCOUNTER — Encounter: Payer: Self-pay | Admitting: Advanced Practice Midwife

## 2019-02-06 ENCOUNTER — Ambulatory Visit (INDEPENDENT_AMBULATORY_CARE_PROVIDER_SITE_OTHER): Payer: Medicaid Other | Admitting: Advanced Practice Midwife

## 2019-02-06 VITALS — BP 109/69 | HR 75 | Temp 97.8°F | Wt 153.0 lb

## 2019-02-06 DIAGNOSIS — R519 Headache, unspecified: Secondary | ICD-10-CM

## 2019-02-06 DIAGNOSIS — Z3A11 11 weeks gestation of pregnancy: Secondary | ICD-10-CM

## 2019-02-06 DIAGNOSIS — R12 Heartburn: Secondary | ICD-10-CM

## 2019-02-06 DIAGNOSIS — O26891 Other specified pregnancy related conditions, first trimester: Secondary | ICD-10-CM

## 2019-02-06 DIAGNOSIS — Z3481 Encounter for supervision of other normal pregnancy, first trimester: Secondary | ICD-10-CM

## 2019-02-06 DIAGNOSIS — Z3480 Encounter for supervision of other normal pregnancy, unspecified trimester: Secondary | ICD-10-CM

## 2019-02-06 DIAGNOSIS — O210 Mild hyperemesis gravidarum: Secondary | ICD-10-CM

## 2019-02-06 MED ORDER — BLOOD PRESSURE KIT DEVI: 1.0000 | 0 refills | Status: DC

## 2019-02-06 MED ORDER — BUTALBITAL-APAP-CAFFEINE 50-325-40 MG PO TABS: 1.0000 | ORAL_TABLET | Freq: Four times a day (QID) | ORAL | 0 refills | Status: DC | PRN

## 2019-02-06 MED ORDER — PANTOPRAZOLE SODIUM 40 MG PO TBEC: 40.0000 mg | DELAYED_RELEASE_TABLET | Freq: Every day | ORAL | 5 refills | Status: DC

## 2019-02-06 NOTE — Progress Notes (Signed)
New OB.  C/o [place] Rx is helping a little.

## 2019-02-06 NOTE — Patient Instructions (Signed)
Morning Sickness ° °Morning sickness is when a woman feels nauseous during pregnancy. This nauseous feeling may or may not come with vomiting. It often occurs in the morning, but it can be a problem at any time of day. Morning sickness is most common during the first trimester. In some cases, it may continue throughout pregnancy. Although morning sickness is unpleasant, it is usually harmless unless the woman develops severe and continual vomiting (hyperemesis gravidarum), a condition that requires more intense treatment. °What are the causes? °The exact cause of this condition is not known, but it seems to be related to normal hormonal changes that occur in pregnancy. °What increases the risk? °You are more likely to develop this condition if: °· You experienced nausea or vomiting before your pregnancy. °· You had morning sickness during a previous pregnancy. °· You are pregnant with more than one baby, such as twins. °What are the signs or symptoms? °Symptoms of this condition include: °· Nausea. °· Vomiting. °How is this diagnosed? °This condition is usually diagnosed based on your signs and symptoms. °How is this treated? °In many cases, treatment is not needed for this condition. Making some changes to what you eat may help to control symptoms. Your health care provider may also prescribe or recommend: °· Vitamin B6 supplements. °· Anti-nausea medicines. °· Ginger. °Follow these instructions at home: °Medicines °· Take over-the-counter and prescription medicines only as told by your health care provider. Do not use any prescription, over-the-counter, or herbal medicines for morning sickness without first talking with your health care provider. °· Taking multivitamins before getting pregnant can prevent or decrease the severity of morning sickness in most women. °Eating and drinking °· Eat a piece of dry toast or crackers before getting out of bed in the morning. °· Eat 5 or 6 small meals a day. °· Eat dry and  bland foods, such as rice or a baked potato. Foods that are high in carbohydrates are often helpful. °· Avoid greasy, fatty, and spicy foods. °· Have someone cook for you if the smell of any food causes nausea and vomiting. °· If you feel nauseous after taking prenatal vitamins, take the vitamins at night or with a snack. °· Snack on protein foods between meals if you are hungry. Nuts, yogurt, and cheese are good options. °· Drink fluids throughout the day. °· Try ginger ale made with real ginger, ginger tea made from fresh grated ginger, or ginger candies. °General instructions °· Do not use any products that contain nicotine or tobacco, such as cigarettes and e-cigarettes. If you need help quitting, ask your health care provider. °· Get an air purifier to keep the air in your house free of odors. °· Get plenty of fresh air. °· Try to avoid odors that trigger your nausea. °· Consider trying these methods to help relieve symptoms: °? Wearing an acupressure wristband. These wristbands are often worn for seasickness. °? Acupuncture. °Contact a health care provider if: °· Your home remedies are not working and you need medicine. °· You feel dizzy or light-headed. °· You are losing weight. °Get help right away if: °· You have persistent and uncontrolled nausea and vomiting. °· You faint. °· You have severe pain in your abdomen. °Summary °· Morning sickness is when a woman feels nauseous during pregnancy. This nauseous feeling may or may not come with vomiting. °· Morning sickness is most common during the first trimester. °· It often occurs in the morning, but it can be a problem at   any time of day. °· In many cases, treatment is not needed for this condition. Making some changes to what you eat may help to control symptoms. °This information is not intended to replace advice given to you by your health care provider. Make sure you discuss any questions you have with your health care provider. °Document Released:  06/04/2006 Document Revised: 03/26/2017 Document Reviewed: 05/16/2016 °Elsevier Patient Education © 2020 [employer] ° °

## 2019-02-06 NOTE — Progress Notes (Signed)
°  ° °Subjective:  ° °Morgan Hartman is a 23 y.o. G3P1011 at [redacted]w[redacted]d by LMP being seen today for her first obstetrical visit.  Her obstetrical history is significant for SAB at 7 wks and has Susceptible to varicella (non-immune), currently pregnant; Morning sickness; and Supervision of other normal pregnancy, antepartum on their problem list.. Patient does intend to breast feed. Pregnancy history fully reviewed. ° °Patient reports headache, heartburn and nausea. 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  Chest XRAY Negative  ° °History reviewed. No pertinent surgical history. °Family History  °Problem Relation Age of Onset  °• Diabetes Maternal Grandmother   °• Arthritis Maternal Grandmother   °• Miscarriages / Stillbirths Maternal Grandmother   °• Depression Maternal Grandfather   °• Diabetes Paternal Grandmother   °• Heart attack Paternal Grandmother   °• Arthritis Paternal Grandmother   °• Heart disease Paternal Grandmother   °• Stroke Paternal Grandfather   ° °Social History  ° °Tobacco Use  °• Smoking status: Never Smoker  °• Smokeless tobacco: Never Used  °Substance Use Topics  °• Alcohol use: Not Currently  °  Comment: occ.  °• Drug use: No  ° ° °Headache in pregnancy, antepartum, first trimester - Plan: butalbital-acetaminophen-caffeine (FIORICET) 50-325-40 MG tablet ° °Supervision of other normal pregnancy, antepartum - Plan: Babyscripts Schedule Optimization, Enroll Patient in Babyscripts, Obstetric Panel, Including HIV, Culture, OB Urine, Genetic Screening, Blood  Pressure Monitoring (BLOOD PRESSURE KIT) DEVI ° °Morning sickness ° °Heartburn during pregnancy in first trimester - Plan: pantoprazole (PROTONIX) 40 MG tablet ° ° ° °No Indications for ASA therapy (per uptodate) °One of the following: °Previous pregnancy with preeclampsia, especially early onset and with an adverse outcome No °Multifetal gestation No °Chronic hypertension No °Type 1 or 2 diabetes mellitus No °Chronic kidney disease No °Autoimmune disease (antiphospholipid syndrome, systemic lupus erythematosus) No ° °Contains Two or more of the following: °Nulliparity No °Obesity (body mass index >30 kg/m2) No °Family history of preeclampsia in mother or sister No °Age ?35 years No °Sociodemographic characteristics (African American race, low socioeconomic level) Yes °Personal risk factors (eg, previous pregnancy with low birth weight or small for gestational age infant, previous adverse pregnancy outcome [eg, stillbirth], interval >10 years between pregnancies) No ° °No Indications for early 1 hour GTT (per uptodate) ° °BMI >25 (>23 in Asian women) AND one of the following ° °Gestational diabetes mellitus in a previous pregnancy No °Glycated hemoglobin ?5.7 percent (39 mmol/mol), impaired glucose tolerance, or impaired fasting glucose on previous testing No °First-degree relative with diabetes No °High-risk race/ethnicity (eg, African American, Latino, Native American, Asian American, Pacific Islander) Yes °History of cardiovascular disease No °Hypertension or on therapy for hypertension No °High-density lipoprotein cholesterol level <35 mg/dL (0.90 mmol/L) and/or a triglyceride level >250 mg/dL (2.82   2.82 mmol/L) No Polycystic ovary syndrome No Physical inactivity No Other clinical condition associated with insulin resistance (eg, severe obesity, acanthosis nigricans) No Previous birth of an infant weighing ?4000 g No Previous stillbirth of unknown cause No Exam   Vitals:   02/06/19 0942  BP: 109/69  Pulse:  75  Temp: 97.8 F (36.6 C)  Weight: 153 lb (69.4 kg)   Fetal Heart Rate (bpm): 160  Uterus:     Pelvic Exam: Perineum: no hemorrhoids, normal perineum   Vulva: normal external genitalia, no lesions   Vagina:  normal mucosa, normal discharge   Cervix: no lesions and normal, pap smear done.    Adnexa: normal adnexa and no mass, fullness, tenderness   Bony Pelvis: average  System: General: well-developed, well-nourished female in no acute distress   Breast:  normal appearance, no masses or tenderness   Skin: normal coloration and turgor, no rashes   Neurologic: oriented, normal, negative, normal mood   Extremities: normal strength, tone, and muscle mass, ROM of all joints is normal   HEENT PERRLA, extraocular movement intact and sclera clear, anicteric   Mouth/Teeth mucous membranes moist, pharynx normal without lesions and dental hygiene good   Neck supple and no masses   Cardiovascular: regular rate and rhythm   Respiratory:  no respiratory distress, normal breath sounds   Abdomen: soft, non-tender; bowel sounds normal; no masses,  no organomegaly     Assessment:   Pregnancy: G3P1011 Patient Active Problem List   Diagnosis Date Noted   Supervision of other normal pregnancy, antepartum 02/03/2019   Morning sickness 01/27/2019   Susceptible to varicella (non-immune), currently pregnant 04/30/2016     Plan:  1. Supervision of other normal pregnancy, antepartum: Pt is considered low risk pregnancy - Babyscripts Schedule Optimization - Enroll Patient in Babyscripts - Obstetric Panel, Including HIV - Culture, OB Urine - Genetic Screening - Blood Pressure Monitoring (BLOOD PRESSURE KIT) DEVI; 1 kit by Does not apply route once a week. Check BP regularly.  Large Cuff.  DX: O09.0  Dispense: 1 kit; Refill: 0  2. Morning sickness (resolved) -Minimal symptoms noted for specific indications of morning sicknmess  3. Headache in pregnancy, antepartum, first trimester-Pt states  that she is unable to drink a lot of water due to nausea after fluid intake. Has been drinking aloe water. Is drinking well less then two liters/day, patient doesn't state she is sure of fluid intake estimate.  -Pt was educated on important of fluid intake.  -Pt was educated on how to take (sip) fluids in order to tolerate fluid intake. - butalbital-acetaminophen-caffeine (FIORICET) 50-325-40 MG tablet; Take 1-2 tablets by mouth every 6 (six) hours as needed for headache.  Dispense: 20 tablet; Refill: 0  -Pt will take this if headache is ongoing past 24hrs without adequate treatment from tylenol  -Pt was educated that she should not take both tylenol and fioricet  4. Heartburn during pregnancy in first trimester: Pt noted that her heartburn has not diminished since previous treatment of antacid regiment. -d/c famotadine  - Start pantoprazole (PROTONIX) 40 MG tablet; Take 1 tablet (40 mg total) by mouth daily.  Dispense: 30 tablet; Refill: 5    Initial labs drawn. Continue prenatal vitamins. Discussed and offered genetic screening options, including Quad screen/AFP, NIPS testing, and option to decline testing. Benefits/risks/alternatives reviewed. Pt aware that anatomy US is form of genetic screening with lower accuracy in detecting trisomies than blood work.  Pt chooses/declines genetic screening today. NIPS: accepted to complete Ultrasound  discussed; fetal anatomic survey: ordered. Problem list reviewed and updated. The nature of [facility] with multiple MDs and other Advanced Practice Providers was explained to patient; also emphasized that residents, students are part of our team. Routine obstetric precautions reviewed. Follow-up appointment pending scheduling  Vance Gather, Medical Student 02/06/19 10:59 AM

## 2019-02-07 LAB — OBSTETRIC PANEL, INCLUDING HIV
Antibody Screen: NEGATIVE
Basophils Absolute: 0 10*3/uL (ref 0.0–0.2)
Basos: 0 %
EOS (ABSOLUTE): 0.1 10*3/uL (ref 0.0–0.4)
Eos: 1 %
HIV Screen 4th Generation wRfx: NONREACTIVE
Hematocrit: 34.7 % (ref 34.0–46.6)
Hemoglobin: 11.8 g/dL (ref 11.1–15.9)
Hepatitis B Surface Ag: NEGATIVE
Immature Grans (Abs): 0 10*3/uL (ref 0.0–0.1)
Immature Granulocytes: 0 %
Lymphocytes Absolute: 1.3 10*3/uL (ref 0.7–3.1)
Lymphs: 27 %
MCH: 30.3 pg (ref 26.6–33.0)
MCHC: 34 g/dL (ref 31.5–35.7)
MCV: 89 fL (ref 79–97)
Monocytes Absolute: 0.3 10*3/uL (ref 0.1–0.9)
Monocytes: 5 %
Neutrophils Absolute: 3.2 10*3/uL (ref 1.4–7.0)
Neutrophils: 67 %
Platelets: 271 10*3/uL (ref 150–450)
RBC: 3.89 x10E6/uL (ref 3.77–5.28)
RDW: 12.6 % (ref 11.7–15.4)
RPR Ser Ql: NONREACTIVE
Rh Factor: POSITIVE
Rubella Antibodies, IGG: 1.94 index (ref >0.99)
WBC: 4.8 10*3/uL (ref 3.4–10.8)

## 2019-02-08 LAB — CULTURE, OB URINE

## 2019-02-08 LAB — URINE CULTURE, OB REFLEX

## 2019-02-13 ENCOUNTER — Encounter: Payer: Self-pay | Admitting: Advanced Practice Midwife

## 2019-02-14 ENCOUNTER — Encounter: Payer: Self-pay | Admitting: Advanced Practice Midwife

## 2019-02-27 ENCOUNTER — Encounter: Payer: Self-pay | Admitting: Obstetrics and Gynecology

## 2019-02-27 DIAGNOSIS — A159 Respiratory tuberculosis unspecified: Secondary | ICD-10-CM | POA: Insufficient documentation

## 2019-03-01 ENCOUNTER — Other Ambulatory Visit: Payer: Self-pay | Admitting: Obstetrics and Gynecology

## 2019-03-06 ENCOUNTER — Other Ambulatory Visit: Payer: Self-pay

## 2019-03-06 ENCOUNTER — Ambulatory Visit (INDEPENDENT_AMBULATORY_CARE_PROVIDER_SITE_OTHER): Payer: Medicaid Other | Admitting: Advanced Practice Midwife

## 2019-03-06 VITALS — BP 111/73 | HR 97 | Wt 152.5 lb

## 2019-03-06 DIAGNOSIS — Z3480 Encounter for supervision of other normal pregnancy, unspecified trimester: Secondary | ICD-10-CM

## 2019-03-06 DIAGNOSIS — A159 Respiratory tuberculosis unspecified: Secondary | ICD-10-CM

## 2019-03-06 DIAGNOSIS — M549 Dorsalgia, unspecified: Secondary | ICD-10-CM

## 2019-03-06 DIAGNOSIS — R519 Headache, unspecified: Secondary | ICD-10-CM

## 2019-03-06 DIAGNOSIS — O26891 Other specified pregnancy related conditions, first trimester: Secondary | ICD-10-CM

## 2019-03-06 DIAGNOSIS — O26892 Other specified pregnancy related conditions, second trimester: Secondary | ICD-10-CM

## 2019-03-06 DIAGNOSIS — R7611 Nonspecific reaction to tuberculin skin test without active tuberculosis: Secondary | ICD-10-CM | POA: Insufficient documentation

## 2019-03-06 DIAGNOSIS — Z3A15 15 weeks gestation of pregnancy: Secondary | ICD-10-CM

## 2019-03-06 DIAGNOSIS — O99891 Other specified diseases and conditions complicating pregnancy: Secondary | ICD-10-CM

## 2019-03-06 DIAGNOSIS — O210 Mild hyperemesis gravidarum: Secondary | ICD-10-CM

## 2019-03-06 MED ORDER — COMFORT FIT MATERNITY SUPP MED MISC: 1.0000 | Freq: Every day | 0 refills | Status: DC

## 2019-03-06 MED ORDER — BLOOD PRESSURE KIT DEVI: 1.0000 | 0 refills | Status: AC

## 2019-03-06 NOTE — Patient Instructions (Signed)

## 2019-03-06 NOTE — Progress Notes (Signed)
   PRENATAL VISIT NOTE  Subjective:  Morgan Hartman is a 23 y.o. G3P1011 at 56w1dbeing seen today for ongoing prenatal care.  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  Patient reports backache.  Contractions: Not present. Vag. Bleeding: None.   . Denies leaking of fluid.   The following portions of the patient's history were reviewed and updated as appropriate: allergies, current medications, past family history, past medical history, past social history, past surgical history and problem list.   Objective:   Vitals:   03/06/19 1024  BP: 111/73  Pulse: 97  Weight: 152 lb 8 oz (69.2 kg)    Fetal Status: Fetal Heart Rate (bpm): 155         General:  Alert, oriented and cooperative. Patient is in no acute distress.  Skin: Skin is warm and dry. No rash noted.   Cardiovascular: Normal heart rate noted  Respiratory: Normal respiratory effort, no problems with respiration noted  Abdomen: Soft, gravid, appropriate for gestational age.  Pain/Pressure: Present     Pelvic: Cervical exam deferred        Extremities: Normal range of motion.  Edema: None  Mental Status: Normal mood and affect. Normal behavior. Normal judgment and thought content.   Assessment and Plan:  Pregnancy: G3P1011 at 143w1d. Supervision of other normal pregnancy, antepartum Lonia's pregnancy is progressing appropriately. Plan for patient to return for an visit closer to the date of her anatomy ultrasound, ~19 weeks.  - AFP, Serum, Open Spina Bifida  - [place] OB COMP + 14 WK; Future - Blood Pressure Monitoring (BLOOD PRESSURE KIT) DEVI; 1 kit by Does not apply route once a week. Check BP regularly.  Large Cuff.  DX: O09.0  Dispense: 1 kit; Refill: 0   2. Back pain affecting pregnancy in second trimester Patient reports onset of lower back pain towards the end of the day especially associated with lifting/carrying her toddler at home and poor posture with sitting. We discussed ergonomic methods of performing activities specifically lifting and carrying her toddler during pregnancy to decrease risk of injury to her back. Patient is amenable to pregnancy support belt to facilite improved posture and as a measure of preemptive support as her gravid belly grows.  - Elastic Bandages & Supports (COMFORT FIT MATERNITY SUPP MED) MISC; 1 Device by Does not apply route daily.  Dispense: 1 each; Refill: 0  3. Headache in pregnancy, antepartum, first trimester Patient reports resolved headache since improvement of her nausea and vomiting and increased fluid intake.    4. Morning sickness Patient reports improved nausea and vomiting since her last visit.    5. 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 No further workup recommended.        Preterm labor symptoms and general obstetric precautions including but not limited to vaginal bleeding, contractions, leaking of fluid and fetal movement were reviewed in detail with the patient. Please refer to After Visit Summary for other counseling recommendations.   Return in about 4 weeks (around 04/03/2019).  Future Appointments  Date Time Provider [place]12/10/2018 10:15 AM [place]-Kirby, LiKathie DikeCNM CWHartleyone    TiMercy MooreMedical Student

## 2019-03-06 NOTE — Progress Notes (Signed)
ROB/AFP.  Chest XR was done at [facility] results negative in 2015.

## 2019-03-09 LAB — AFP, SERUM, OPEN SPINA BIFIDA
AFP MoM: 0.63
AFP Value: 18.2 ng/mL
Gest. Age on Collection Date: 15.1 weeks
Maternal Age At EDD: 25.1 yr
OSBR Risk 1 IN: 10000
Test Results:: NEGATIVE
Weight: 153 [lb_av]

## 2019-04-03 ENCOUNTER — Other Ambulatory Visit: Payer: Self-pay

## 2019-04-03 ENCOUNTER — Telehealth (INDEPENDENT_AMBULATORY_CARE_PROVIDER_SITE_OTHER): Payer: Medicaid Other | Admitting: Advanced Practice Midwife

## 2019-04-03 DIAGNOSIS — Z3480 Encounter for supervision of other normal pregnancy, unspecified trimester: Secondary | ICD-10-CM

## 2019-04-03 DIAGNOSIS — Z3482 Encounter for supervision of other normal pregnancy, second trimester: Secondary | ICD-10-CM

## 2019-04-03 DIAGNOSIS — Z3A19 19 weeks gestation of pregnancy: Secondary | ICD-10-CM

## 2019-04-03 NOTE — Progress Notes (Signed)
Virtual ROB  Pt intake completed by CNM  CMA did not speak with patient.

## 2019-04-03 NOTE — Progress Notes (Addendum)
   TELEHEALTH OBSTETRICS PRENATAL VIRTUAL VIDEO VISIT ENCOUNTER NOTE  Provider location: [facility] [place]   I connected with Morgan Hartman on 04/03/19 at 10:15 AM EST by MyChart Video Encounter at home and verified that I am speaking with the correct person using two identifiers.   I discussed the limitations, risks, security and privacy concerns of performing an evaluation and management service virtually and the availability of in person appointments. I also discussed with the patient that there may be a patient responsible charge related to this service. The patient expressed understanding and agreed to proceed. Subjective:  Morgan Hartman is a 23 y.o. G3P1011 at [redacted]w[redacted]d being seen today for ongoing prenatal care.  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  Patient reports no complaints. No bleeding. Feeling some fetal flutters, no leaking fluid or abdominal pain/cramping.    The following portions of the patient's history were reviewed and updated as appropriate: allergies, current medications, past family history, past medical history, past social history, past surgical history and problem list.   Objective:  There were no vitals filed for this visit.  Fetal Status:           General:  Alert, oriented and cooperative. Patient is in no acute distress.  Respiratory: Normal respiratory effort, no problems with respiration noted  Mental Status: Normal mood and affect. Normal behavior. Normal judgment and thought content.  Rest of physical exam deferred due to type of encounter  Imaging: No results found.  Assessment and Plan:  Pregnancy: G3P1011 at [redacted]w[redacted]d 1. Supervision of other normal pregnancy,  antepartum --Pt reports fetal movement, denies cramping, LOF, or vaginal bleeding --No BP on file with home cuff, pt will have BP on 12/9 at her MFM [place] --Anticipatory guidance about next visits/weeks of pregnancy given. --Questions answered about safety of hair products/perms/hairdye in pregnancy.  Topical products are likely safe but no big studies or high quality data.  Studies on hairdressers find no association with products and pregnancy complications. --Next visit virtual in 4 weeks, then in person for 26-28 week visit for GTT.  Preterm labor symptoms and general obstetric precautions including but not limited to vaginal bleeding, contractions, leaking of fluid and fetal movement were reviewed in detail with the patient. I discussed the assessment and treatment plan with the patient. The patient was provided an opportunity to ask questions and all were answered. The patient agreed with the plan and demonstrated an understanding of the instructions. The patient was advised to call back or seek an in-person office evaluation/go to MAU at [facility] for any urgent or concerning symptoms. Please refer to After Visit Summary for other counseling recommendations.   I provided 10 minutes of face-to-face time during this encounter.  Return in about 4 weeks (around 05/01/2019).  Future Appointments  Date Time Provider [place]  04/05/2019  9:00 AM WH-MFC [place] 3 WH-MFCUS MFC-US    Tirso Laws [place], [place] for [employer], [place]

## 2019-04-05 ENCOUNTER — Encounter (HOSPITAL_COMMUNITY): Payer: Self-pay

## 2019-04-05 ENCOUNTER — Other Ambulatory Visit: Payer: Self-pay

## 2019-04-05 ENCOUNTER — Ambulatory Visit (HOSPITAL_COMMUNITY)
Admission: RE | Admit: 2019-04-05 | Discharge: 2019-04-05 | Disposition: A | Discharge status: Home / Self Care | Payer: Medicaid Other | Source: Ambulatory Visit | Attending: Obstetrics and Gynecology | Admitting: Obstetrics and Gynecology

## 2019-04-05 ENCOUNTER — Telehealth: Payer: Self-pay

## 2019-04-05 DIAGNOSIS — Z20822 Contact with and (suspected) exposure to covid-19: Secondary | ICD-10-CM

## 2019-04-05 DIAGNOSIS — Z3480 Encounter for supervision of other normal pregnancy, unspecified trimester: Secondary | ICD-10-CM

## 2019-04-05 NOTE — Telephone Encounter (Signed)
Called pt to see if blood pressure kit was received and if she has babyrx, no answer, left vm

## 2019-04-05 NOTE — Telephone Encounter (Signed)
S/w patient and confirmed that she does have a BP cuff and is using babyrx, notified provider.

## 2019-04-07 LAB — NOVEL CORONAVIRUS, NAA: SARS-CoV-2, NAA: NOT DETECTED

## 2019-04-20 ENCOUNTER — Other Ambulatory Visit (HOSPITAL_COMMUNITY): Payer: Self-pay | Admitting: *Deleted

## 2019-04-20 ENCOUNTER — Other Ambulatory Visit: Payer: Self-pay

## 2019-04-20 ENCOUNTER — Other Ambulatory Visit: Payer: Self-pay | Admitting: Advanced Practice Midwife

## 2019-04-20 ENCOUNTER — Ambulatory Visit (HOSPITAL_COMMUNITY)
Admission: RE | Admit: 2019-04-20 | Discharge: 2019-04-20 | Disposition: A | Discharge status: Home / Self Care | Payer: Medicaid Other | Source: Ambulatory Visit | Attending: Obstetrics and Gynecology | Admitting: Obstetrics and Gynecology

## 2019-04-20 DIAGNOSIS — Z3480 Encounter for supervision of other normal pregnancy, unspecified trimester: Secondary | ICD-10-CM

## 2019-04-20 DIAGNOSIS — O358XX0 Maternal care for other (suspected) fetal abnormality and damage, not applicable or unspecified: Secondary | ICD-10-CM

## 2019-04-20 DIAGNOSIS — O283 Abnormal ultrasonic finding on antenatal screening of mother: Secondary | ICD-10-CM

## 2019-04-20 DIAGNOSIS — Z3A21 21 weeks gestation of pregnancy: Secondary | ICD-10-CM

## 2019-04-24 ENCOUNTER — Encounter: Payer: Self-pay | Admitting: Advanced Practice Midwife

## 2019-04-24 DIAGNOSIS — O283 Abnormal ultrasonic finding on antenatal screening of mother: Secondary | ICD-10-CM | POA: Insufficient documentation

## 2019-04-28 NOTE — L&D Delivery Note (Signed)
OB/GYN Faculty Practice Delivery Note  Morgan Hartman is a 24 y.o. G3P1011 s/p NSVD at [redacted]w[redacted]d. She was admitted for SOL.   ROM: 5h 27m with clear fluid GBS Status: negative Maximum Maternal Temperature: 98.2*F  Labor Progress: Admitted at 5 cm dilation. Augmented with AROM and pitocin. She progressed to complete and delivered shortly after.  Delivery Date/Time: 08/21/19, 1702 Delivery: Called to room and patient was complete and pushing. Head delivered ROA. One nuchal cord present, reduced at perineum. Shoulder and body delivered in usual fashion. Infant with spontaneous cry, placed on mother's abdomen, dried and stimulated. Cord clamped x 2 after 1-minute delay, and cut by FOB under my direct supervision. Cord blood drawn. Placenta delivered spontaneously with gentle cord traction. Fundus firm with massage and Pitocin. Labia, perineum, vagina, and cervix were inspected, and a right periurethral tear was noted- hemostatic and not repaired.   Due to brisk bleeding after delivery, 1000 mg PR cytotec placed. Fundus firm and at U.  Placenta: 3 vessel cord, intact, to L&D Complications: none Lacerations: right periurethral, hemostatic and not repaired EBL: 191 mL Analgesia: None  Postpartum Planning [x]  message to sent to schedule follow-up  [x]  vaccines UTD  Infant: female  APGARs 9,9  weight  , DO OB/GYN Fellow, Faculty Practice

## 2019-05-01 ENCOUNTER — Telehealth (INDEPENDENT_AMBULATORY_CARE_PROVIDER_SITE_OTHER): Payer: Medicaid Other | Admitting: Advanced Practice Midwife

## 2019-05-01 VITALS — BP 117/72 | HR 85

## 2019-05-01 DIAGNOSIS — Z3480 Encounter for supervision of other normal pregnancy, unspecified trimester: Secondary | ICD-10-CM

## 2019-05-01 DIAGNOSIS — Z3A23 23 weeks gestation of pregnancy: Secondary | ICD-10-CM

## 2019-05-01 DIAGNOSIS — O99891 Other specified diseases and conditions complicating pregnancy: Secondary | ICD-10-CM

## 2019-05-01 DIAGNOSIS — M549 Dorsalgia, unspecified: Secondary | ICD-10-CM

## 2019-05-01 MED ORDER — COMFORT FIT MATERNITY SUPP MED MISC: 1.0000 | Freq: Every day | 0 refills | Status: DC

## 2019-05-01 NOTE — Progress Notes (Signed)
TELEHEALTH OBSTETRICS PRENATAL VIRTUAL VIDEO VISIT ENCOUNTER NOTE  Provider location: [facility] for [facility] at [place]   I connected with Morgan Hartman on 05/01/19 at  2:20 PM EST by MyChart Video Encounter at home and verified that I am speaking with the correct person using two identifiers.   I discussed the limitations, risks, security and privacy concerns of performing an evaluation and management service virtually and the availability of in person appointments. I also discussed with the patient that there may be a patient responsible charge related to this service. The patient expressed understanding and agreed to proceed. Subjective:  Morgan Hartman is a 24 y.o. G3P1011 at [redacted]w[redacted]d being seen today for ongoing prenatal care.  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  Patient reports no complaints.  Contractions: Irritability. Vag. Bleeding: None.  Movement: Present. Denies any leaking of fluid.   The following portions of the patient's history were reviewed and updated as appropriate: allergies, current medications, past family history, past medical history, past social history, past surgical history and problem list.   Objective:   Vitals:   05/01/19 1417  BP: 117/72  Pulse: 85    Fetal Status:     Movement: Present     General:  Alert, oriented and cooperative. Patient is in no acute distress.  Respiratory: Normal respiratory effort, no problems with respiration noted  Mental Status: Normal mood and affect. Normal behavior. Normal judgment and thought content.  Rest of physical exam deferred due to type of encounter  Imaging: [place] MFM OB DETAIL +14  WK  Result Date: 04/20/2019 ----------------------------------------------------------------------  OBSTETRICS REPORT                       (Signed Final 04/20/2019 10:29 am) ---------------------------------------------------------------------- Patient Info  ID #:       [id number]                          D.O.B.:  02-Nov-1995 (23 yrs)  Name:       Morgan Hartman                      Visit Date: 04/20/2019 08:49 am ---------------------------------------------------------------------- Performed By  Performed By:     Emeline Darling BS,      Ref. Address:     [address]                    RDMS                                                             Rd                                                             [place]  Attending:  Ma Rings MD         [facility]  Referred By:      Wilmer Floor LEFTWICH-                    Craige Cotta CNM ---------------------------------------------------------------------- Orders   #  Description                          Code         Ordered By   1  [place] MFM OB DETAIL +14 WK              76811.01     Farhiya Rosten LEFTWICH-                                                        KIRBY  ----------------------------------------------------------------------   #  Order #                    Accession #                 Episode #   1  [id number]                  [id number]                  [id number]  ---------------------------------------------------------------------- Indications   Echogenic intracardiac focus of the heart      O35.8XX0   (EIF)   Encounter for antenatal screening for          Z36.3   malformations   Negative AFP (Negative Horizon)(Low Risk   NIPS)   [redacted] weeks gestation of pregnancy                Z3A.21  ---------------------------------------------------------------------- Fetal Evaluation  Num Of Fetuses:         1  Fetal Heart Rate(bpm):  155  Cardiac Activity:       Observed   Presentation:           Breech  Placenta:               Posterior Fundal  P. Cord Insertion:      Visualized  Amniotic Fluid  AFI FV:      Within normal limits                              Largest Pocket(cm)                              2.8 ---------------------------------------------------------------------- Biometry  BPD:      49.7  mm     G. Age:  21w 0d         27  %    CI:  72.34   %    70 - 86                                                          FL/HC:      18.1   %    18.4 - 20.2  HC:      185.9  mm     G. Age:  20w 6d         15  %    HC/AC:      1.06        1.06 - 1.25  AC:      175.2  mm     G. Age:  22w 3d         71  %    FL/BPD:     67.6   %    71 - 87  FL:       33.6  mm     G. Age:  20w 4d         12  %    FL/AC:      19.2   %    20 - 24  HUM:      33.6  mm     G. Age:  21w 3d         44  %  Est. FW:     429  gm    0 lb 15 oz      41  % ---------------------------------------------------------------------- OB History  Gravidity:    3         Term:   1        Prem:   0        SAB:   1  TOP:          0       Ectopic:  0        Living: 1 ---------------------------------------------------------------------- Gestational Age  LMP:           21w 4d        Date:  11/20/18                 EDD:   08/27/19  U/S Today:     21w 2d                                        EDD:   08/29/19  Best:          21w 4d     Det. By:  LMP  (11/20/18)          EDD:   08/27/19 ---------------------------------------------------------------------- Anatomy  Cranium:               Appears normal         LVOT:                   Not well visualized  Cavum:                 Appears normal         Aortic Arch:            Not well visualized  Ventricles:  Appears normal         Ductal Arch:            Not well visualized  Choroid Plexus:        Appears normal         Diaphragm:              Appears normal  Cerebellum:            Appears normal         Stomach:                Appears normal, left                                                                         sided  Posterior Fossa:       Appears normal         Abdomen:                Appears normal  Nuchal Fold:           Not applicable (>99    Abdominal Wall:         Appears nml (cord                         wks GA)                                        insert, abd wall)  Face:                  Appears normal         Cord Vessels:           Appears normal (3                         (orbits and profile)                           vessel cord)  Lips:                  Appears normal         Kidneys:                Appear normal  Palate:                Appears normal         Bladder:                Appears normal  Thoracic:              Appears normal         Spine:                  Not well visualized  Heart:                 Echogenic focus        Upper Extremities:      Appears normal  in LV  RVOT:                  Not well visualized    Lower Extremities:      Appears normal  Other:  Female gender Technically difficult due to fetal position. ---------------------------------------------------------------------- Cervix Uterus Adnexa  Cervix  Length:            3.1  cm.  Normal appearance by transabdominal scan. ---------------------------------------------------------------------- Comments  This patient was seen for a detailed fetal anatomy scan. She  denies any significant past medical history and denies any  problems in her current pregnancy.  She had a cell free DNA test earlier in her pregnancy which  indicated a low risk for trisomy 61, 13, and 13. A female fetus  is predicted.  She was informed that the fetal growth and amniotic fluid  level were appropriate for her gestational age.  On today's exam, an intracardiac echogenic focus was noted  in the left ventricle of the fetal heart.  The small association  between an echogenic focus and Down syndrome was  discussed. Due to the echogenic focus noted today, the  patient was offered and declined  an amniocentesis today for  definitive diagnosis of fetal aneuploidy.  She reports that she  is comfortable with her negative cell free DNA test.  The patient was informed that anomalies may be missed due  to technical limitations. If the fetus is in a suboptimal position  or maternal habitus is increased, visualization of the fetus in  the maternal uterus may be impaired.  The views of the fetal heart were suboptimal today due to the  fetal position.  A follow-up exam was scheduled in 4 weeks to assess the  fetal growth and to obtain better fetal cardiac views. ----------------------------------------------------------------------                   Ma Rings, MD Electronically Signed Final Report   04/20/2019 10:29 am ----------------------------------------------------------------------   Assessment and Plan:  Pregnancy: G3P1011 at [redacted]w[redacted]d 1. Back pain affecting pregnancy in second trimester --Pt with some occasional back pain, especially with prolonged standing. - Elastic Bandages & Supports (COMFORT FIT MATERNITY SUPP MED) MISC; 1 Device by Does not apply route daily.  Dispense: 1 each; Refill: 0  2. Supervision of other normal pregnancy, antepartum --Pt reports good fetal movement, denies cramping, LOF, or vaginal bleeding --Anticipatory guidance about next visits/weeks of pregnancy given. --Next visit in office in 4 weeks for GTT  Preterm labor symptoms and general obstetric precautions including but not limited to vaginal bleeding, contractions, leaking of fluid and fetal movement were reviewed in detail with the patient. I discussed the assessment and treatment plan with the patient. The patient was provided an opportunity to ask questions and all were answered. The patient agreed with the plan and demonstrated an understanding of the instructions. The patient was advised to call back or seek an in-person office evaluation/go to MAU at [facility] for any urgent or concerning  symptoms. Please refer to After Visit Summary for other counseling recommendations.   I provided 10 minutes of face-to-face time during this encounter.  No follow-ups on file.  Future Appointments  Date Time Provider Department Center  05/19/2019  1:15 PM WH-MFC [place] 4 WH-MFCUS MFC-US    Sharen Counter, CNM Center for [employer], [facility]

## 2019-05-01 NOTE — Progress Notes (Signed)
Pt presents for mychart visit ROB. Pt identified with 2 patient identifiers. Her bp today is 117/72. Patient has no concerns.

## 2019-05-03 ENCOUNTER — Other Ambulatory Visit: Payer: Self-pay

## 2019-05-03 DIAGNOSIS — M549 Dorsalgia, unspecified: Secondary | ICD-10-CM

## 2019-05-03 DIAGNOSIS — O99891 Other specified diseases and conditions complicating pregnancy: Secondary | ICD-10-CM

## 2019-05-03 MED ORDER — COMFORT FIT MATERNITY SUPP MED MISC: 1.0000 | Freq: Every day | 0 refills | Status: DC

## 2019-05-19 ENCOUNTER — Other Ambulatory Visit (HOSPITAL_COMMUNITY): Payer: Self-pay | Admitting: *Deleted

## 2019-05-19 ENCOUNTER — Ambulatory Visit (HOSPITAL_COMMUNITY)
Admission: RE | Admit: 2019-05-19 | Discharge: 2019-05-19 | Disposition: A | Discharge status: Home / Self Care | Payer: Medicaid Other | Source: Ambulatory Visit | Attending: Obstetrics and Gynecology | Admitting: Obstetrics and Gynecology

## 2019-05-19 ENCOUNTER — Other Ambulatory Visit: Payer: Self-pay

## 2019-05-19 DIAGNOSIS — Z362 Encounter for other antenatal screening follow-up: Secondary | ICD-10-CM

## 2019-05-19 DIAGNOSIS — O283 Abnormal ultrasonic finding on antenatal screening of mother: Secondary | ICD-10-CM | POA: Diagnosis present

## 2019-05-19 DIAGNOSIS — Z3A25 25 weeks gestation of pregnancy: Secondary | ICD-10-CM | POA: Diagnosis not present

## 2019-05-19 DIAGNOSIS — O358XX0 Maternal care for other (suspected) fetal abnormality and damage, not applicable or unspecified: Secondary | ICD-10-CM | POA: Diagnosis not present

## 2019-05-29 ENCOUNTER — Encounter: Payer: Self-pay | Admitting: Certified Nurse Midwife

## 2019-05-29 ENCOUNTER — Other Ambulatory Visit: Payer: Medicaid Other

## 2019-05-29 ENCOUNTER — Other Ambulatory Visit: Payer: Self-pay

## 2019-05-29 ENCOUNTER — Ambulatory Visit (INDEPENDENT_AMBULATORY_CARE_PROVIDER_SITE_OTHER): Payer: Medicaid Other | Admitting: Certified Nurse Midwife

## 2019-05-29 VITALS — BP 107/72 | HR 76 | Wt 164.0 lb

## 2019-05-29 DIAGNOSIS — O99012 Anemia complicating pregnancy, second trimester: Secondary | ICD-10-CM

## 2019-05-29 DIAGNOSIS — Z3480 Encounter for supervision of other normal pregnancy, unspecified trimester: Secondary | ICD-10-CM

## 2019-05-29 DIAGNOSIS — O283 Abnormal ultrasonic finding on antenatal screening of mother: Secondary | ICD-10-CM

## 2019-05-29 DIAGNOSIS — Z3A27 27 weeks gestation of pregnancy: Secondary | ICD-10-CM

## 2019-05-29 DIAGNOSIS — D509 Iron deficiency anemia, unspecified: Secondary | ICD-10-CM

## 2019-05-29 DIAGNOSIS — O99019 Anemia complicating pregnancy, unspecified trimester: Secondary | ICD-10-CM

## 2019-05-29 NOTE — Progress Notes (Signed)
ROB 2hr GTT today. CC: None

## 2019-05-29 NOTE — Progress Notes (Signed)
   PRENATAL VISIT NOTE  Subjective:  Morgan Hartman is a 24 y.o. G3P1011 at [redacted]w[redacted]d being seen today for ongoing prenatal care.  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  Patient reports no complaints.  Contractions: Not present. Vag. Bleeding: None.  Movement: Present. Denies leaking of fluid.   The following portions of the patient's history were reviewed and updated as appropriate: allergies, current medications, past family history, past medical history, past social history, past surgical history and problem list.   Objective:   Vitals:   05/29/19 0933  BP: 107/72  Pulse: 76  Weight: 164 lb (74.4 kg)    Fetal Status: Fetal Heart Rate (bpm): 150 Fundal Height: 28 cm Movement: Present     General:  Alert, oriented and cooperative. Patient is in no acute distress.  Skin: Skin is warm and dry. No rash noted.   Cardiovascular: Normal heart rate noted  Respiratory: Normal respiratory effort, no problems with respiration noted  Abdomen: Soft, gravid, appropriate for gestational age.  Pain/Pressure: Present     Pelvic: Cervical exam deferred        Extremities: Normal range of motion.  Edema: Trace  Mental Status: Normal mood and affect. Normal behavior. Normal judgment and thought content.   Assessment and Plan:  Pregnancy: G3P1011 at [redacted]w[redacted]d 1. Supervision of other normal pregnancy, antepartum - Patient doing well, no complaints - Routine prenatal care - Anticipatory guidance on upcoming appointments with next being virtual mychart appointment  - encouraged patient to call or send message with questions, problems or concerns  - Reviewed safety, reassurance about COVID-19 for pregnancy at this time. The office remains  open if pt needs to be seen and MAU is open 24 hours/day for OB emergencies. Encouraged patient that if virtual appointment is scheduled and she has concerns such as bleeding, abdominal pain then to call the office and have appointment changed to in person  - CBC - Glucose Tolerance, 2 Hours w/1 Hour - RPR - HIV Antibody (routine testing w rflx)  2. Fetal echogenic intracardiac focus on prenatal ultrasound - echogenic focus continues to seen on US performed on 1/22 - overall EFW obtained at that time measuring at 9th percentile  - f/u fetal growth [place] scheduled for 2/15 - Educated and discussed with patient SGA vs FGR, patient reports being 5'5" and FOB 5'9", first child 6lb 9.6oz at time of delivery    Preterm labor symptoms and general obstetric precautions including but not limited to vaginal bleeding, contractions, leaking of fluid and fetal movement were reviewed in detail with the patient. Please refer to After Visit Summary for other counseling recommendations.   Return in about 4 weeks (around 06/26/2019) for ROB-mychart.  Future Appointments  Date Time Provider Department Center  06/12/2019 10:45 AM WH-MFC [place] 5 WH-MFCUS MFC-US  06/12/2019 10:50 AM WH-MFC NURSE WH-MFC MFC-US  06/26/2019 10:30 AM Brock Bad, MD [facility] None    Sharyon Cable, CNM

## 2019-05-29 NOTE — Patient Instructions (Signed)
Intrauterine Growth Restriction  Intrauterine growth restriction (IUGR) is when a baby is not growing normally during pregnancy. A baby with IUGR is smaller than it should be and may weigh less than normal at birth. IUGR can result from a problem with the organ that supplies the unborn baby (fetus) with oxygen and nutrition (placenta). Usually, there is no way to prevent this type of problem. Babies with IUGR are at higher risk for early delivery and needing special (intensive) care after birth. What are the causes? The most common cause of IUGR is a problem with the placenta or umbilical cord that causes the fetus to get less oxygen or nutrition than needed. Other causes include:  The mother eating a very unhealthy diet (poor maternal nutrition).  Exposure to chemicals found in substances such as cigarettes, alcohol, and some drugs.  Some prescription medicines.  Other problems that develop in the womb (congenital birth defects).  Genetic disorders.  Infection.  Carrying more than one baby. What increases the risk? This condition is more likely to affect babies of mothers who:  Are over the age of 35 at the time of delivery.  Are younger than age 16 at the time of delivery.  Have medical conditions such as high blood pressure, preeclampsia, diabetes, heart or kidney disease, systemic lupus erythematosus, or anemia.  Live at a very high altitude during pregnancy.  Have a personal history or family history of: ? IUGR. ? A genetic disorder.  Take medicines during pregnancy that are related to congenital disabilities.  Come into contact with infected cat feces (toxoplasmosis).  Come into contact with chickenpox (varicella) or German measles (rubella).  Have or are at risk of getting an infectious disease such as syphilis, HIV, or herpes.  Eat an unhealthy diet during pregnancy.  Weigh less than 100 pounds.  Have had treatments to help her have children (infertility  treatments).  Use tobacco, drugs, or alcohol during pregnancy. What are the signs or symptoms? IUGR does not cause many symptoms. You might notice that your baby does not move or kick very often. Also, your belly may not be as big as expected for the stage of your pregnancy. How is this diagnosed? This condition is diagnosed with physical exams and prenatal exams. You may also have:  Fundal measurements to check the size of your uterus.  An ultrasound done to measure your baby's size compared to the size of other babies at the same stage of development (gestational age). Your health care provider will monitor your baby's growth with ultrasounds throughout pregnancy. You may also have tests to find the cause of IUGR. These may include:  Amniocentesis. This is a procedure that involves passing a needle into the uterus to collect a sample of fluid that surrounds the fetus (amniotic fluid). This may be done to check for signs of infection or congenital defects.  Tests to evaluate blood flow to your baby and placenta. How is this treated? In most cases, the goal of treatment is to treat the cause of IUGR. Your health care providers will monitor your pregnancy closely and help you manage your pregnancy. If your condition is caused by a placenta problem and your baby is not getting enough blood, you may need:  Medicine to start labor and deliver your baby early (induction).  Cesarean delivery, also called a C-section. In this procedure, your baby is delivered through an incision in your abdomen and uterus. Follow these instructions at home: Medicines  Take over-the-counter and prescription medicines   only as told by your health care provider. This includes vitamins and supplements.  Make sure that your health care provider knows about and approves of all the medicines, supplements, vitamins, eye drops, and creams that you use. General instructions  Eat a healthy diet that includes fresh fruits  and vegetables, lean proteins, whole grains, and calcium-rich foods such as milk, yogurt, and dark, leafy greens. Work with your health care provider or a dietitian to make sure that: ? You are getting enough nutrients. ? You are gaining enough weight.  Rest as needed. Try to get at least 8 hours of sleep every night.  Do not drink alcohol or use drugs.  Do not use any products that contain nicotine or tobacco, such as cigarettes and e-cigarettes. If you need help quitting, ask your health care provider.  Keep all follow-up visits as told by your health care provider. This is important. Get help right away if you:  Notice that your baby is moving less than usual or is not moving.  Have contractions that are 5 minutes or less apart, or that increase in frequency, intensity, or length.  Have signs and symptoms of infection, including a fever.  Have vaginal bleeding.  Have increased swelling in your legs, hands, or face.  Have vision changes, including seeing spots or having blurry or double vision.  Have a severe headache that does not go away.  Have sudden, sharp abdominal pain or low back pain.  Have an uncontrolled gush or trickle of fluid from your vagina. Summary  Intrauterine growth restriction (IUGR) is when a baby is not growing normally during pregnancy.  The most common cause of IUGR is a problem with the placenta or umbilical cord that causes the fetus to get less oxygen or nutrition than needed.  This condition is diagnosed with physical and prenatal exams. Your health care provider will monitor your baby's growth with ultrasounds throughout pregnancy.  Make sure that your health care provider knows about and approves of all the medicines, supplements, vitamins, eye drops, and creams that you use. This information is not intended to replace advice given to you by your health care provider. Make sure you discuss any questions you have with your health care  provider. Document Revised: 03/26/2017 Document Reviewed: 02/11/2017 Elsevier Patient Education  2020 [employer].   Third Trimester of Pregnancy  The third trimester is from week 28 through week 40 (months 7 through 9). This trimester is when your unborn baby (fetus) is growing very fast. At the end of the ninth month, the unborn baby is about 20 inches in length. It weighs about 6-10 pounds. Follow these instructions at home: Medicines  Take over-the-counter and prescription medicines only as told by your doctor. Some medicines are safe and some medicines are not safe during pregnancy.  Take a prenatal vitamin that contains at least 600 micrograms (mcg) of folic acid.  If you have trouble pooping (constipation), take medicine that will make your stool soft (stool softener) if your doctor approves. Eating and drinking   Eat regular, healthy meals.  Avoid raw meat and uncooked cheese.  If you get low calcium from the food you eat, talk to your doctor about taking a daily calcium supplement.  Eat four or five small meals rather than three large meals a day.  Avoid foods that are high in fat and sugars, such as fried and sweet foods.  To prevent constipation: ? Eat foods that are high in fiber, like fresh fruits  and vegetables, whole grains, and beans. ? Drink enough fluids to keep your pee (urine) clear or pale yellow. Activity  Exercise only as told by your doctor. Stop exercising if you start to have cramps.  Avoid heavy lifting, wear low heels, and sit up straight.  Do not exercise if it is too hot, too humid, or if you are in a place of great height (high altitude).  You may continue to have sex unless your doctor tells you not to. Relieving pain and discomfort  Wear a good support bra if your breasts are tender.  Take frequent breaks and rest with your legs raised if you have leg cramps or low back pain.  Take warm water baths (sitz baths) to soothe pain or  discomfort caused by hemorrhoids. Use hemorrhoid cream if your doctor approves.  If you develop puffy, bulging veins (varicose veins) in your legs: ? Wear support hose or compression stockings as told by your doctor. ? Raise (elevate) your feet for 15 minutes, 3-4 times a day. ? Limit salt in your food. Safety  Wear your seat belt when driving.  Make a list of emergency phone numbers, including numbers for family, friends, the hospital, and police and fire departments. Preparing for your baby's arrival To prepare for the arrival of your baby:  Take prenatal classes.  Practice driving to the hospital.  Visit the hospital and tour the maternity area.  Talk to your work about taking leave once the baby comes.  Pack your hospital bag.  Prepare the baby's room.  Go to your doctor visits.  Buy a rear-facing car seat. Learn how to install it in your car. General instructions  Do not use hot tubs, steam rooms, or saunas.  Do not use any products that contain nicotine or tobacco, such as cigarettes and e-cigarettes. If you need help quitting, ask your doctor.  Do not drink alcohol.  Do not douche or use tampons or scented sanitary pads.  Do not cross your legs for long periods of time.  Do not travel for long distances unless you must. Only do so if your doctor says it is okay.  Visit your dentist if you have not gone during your pregnancy. Use a soft toothbrush to brush your teeth. Be gentle when you floss.  Avoid cat litter boxes and soil used by cats. These carry germs that can cause birth defects in the baby and can cause a loss of your baby (miscarriage) or stillbirth.  Keep all your prenatal visits as told by your doctor. This is important. Contact a doctor if:  You are not sure if you are in labor or if your water has broken.  You are dizzy.  You have mild cramps or pressure in your lower belly.  You have a nagging pain in your belly area.  You continue to feel  sick to your stomach, you throw up, or you have watery poop.  You have bad smelling fluid coming from your vagina.  You have pain when you pee. Get help right away if:  You have a fever.  You are leaking fluid from your vagina.  You are spotting or bleeding from your vagina.  You have severe belly cramps or pain.  You lose or gain weight quickly.  You have trouble catching your breath and have chest pain.  You notice sudden or extreme puffiness (swelling) of your face, hands, ankles, feet, or legs.  You have not felt the baby move in over an hour.  You have severe headaches that do not go away with medicine.  You have trouble seeing.  You are leaking, or you are having a gush of fluid, from your vagina before you are 37 weeks.  You have regular belly spasms (contractions) before you are 37 weeks. Summary  The third trimester is from week 28 through week 40 (months 7 through 9). This time is when your unborn baby is growing very fast.  Follow your doctor's advice about medicine, food, and activity.  Get ready for the arrival of your baby by taking prenatal classes, getting all the baby items ready, preparing the baby's room, and visiting your doctor to be checked.  Get help right away if you are bleeding from your vagina, or you have chest pain and trouble catching your breath, or if you have not felt your baby move in over an hour. This information is not intended to replace advice given to you by your health care provider. Make sure you discuss any questions you have with your health care provider. Document Revised: 08/04/2018 Document Reviewed: 05/19/2016 Elsevier Patient Education  2020 [employer].

## 2019-05-30 DIAGNOSIS — D509 Iron deficiency anemia, unspecified: Secondary | ICD-10-CM | POA: Insufficient documentation

## 2019-05-30 DIAGNOSIS — O99019 Anemia complicating pregnancy, unspecified trimester: Secondary | ICD-10-CM | POA: Insufficient documentation

## 2019-05-30 LAB — CBC
Hematocrit: 30.8 % — ABNORMAL LOW (ref 34.0–46.6)
Hemoglobin: 10.1 g/dL — ABNORMAL LOW (ref 11.1–15.9)
MCH: 29.4 pg (ref 26.6–33.0)
MCHC: 32.8 g/dL (ref 31.5–35.7)
MCV: 90 fL (ref 79–97)
Platelets: 277 10*3/uL (ref 150–450)
RBC: 3.43 x10E6/uL — ABNORMAL LOW (ref 3.77–5.28)
RDW: 12.9 % (ref 11.7–15.4)
WBC: 6.5 10*3/uL (ref 3.4–10.8)

## 2019-05-30 LAB — HIV ANTIBODY (ROUTINE TESTING W REFLEX): HIV Screen 4th Generation wRfx: NONREACTIVE

## 2019-05-30 LAB — GLUCOSE TOLERANCE, 2 HOURS W/ 1HR
Glucose, 1 hour: 149 mg/dL (ref 65–179)
Glucose, 2 hour: 85 mg/dL (ref 65–152)
Glucose, Fasting: 79 mg/dL (ref 65–91)

## 2019-05-30 LAB — RPR: RPR Ser Ql: NONREACTIVE

## 2019-05-30 MED ORDER — FERROUS GLUCONATE 324 (38 FE) MG PO TABS: 324.0000 mg | ORAL_TABLET | Freq: Every day | ORAL | 3 refills | Status: DC

## 2019-05-30 NOTE — Addendum Note (Signed)
Addended by: Sharyon Cable on: 05/30/2019 01:43 PM   Modules accepted: Orders

## 2019-06-08 ENCOUNTER — Telehealth: Payer: Self-pay | Admitting: *Deleted

## 2019-06-08 NOTE — Telephone Encounter (Signed)
Pt called to office to ask for a note so that she may be excused from [employer] due to pregnancy. Pt made aware that letter cannot be given as pregnancy is not a reason to miss jury duty.

## 2019-06-12 ENCOUNTER — Ambulatory Visit (HOSPITAL_COMMUNITY): Payer: Medicaid Other | Admitting: *Deleted

## 2019-06-12 ENCOUNTER — Encounter (HOSPITAL_COMMUNITY): Payer: Self-pay

## 2019-06-12 ENCOUNTER — Ambulatory Visit (HOSPITAL_COMMUNITY)
Admission: RE | Admit: 2019-06-12 | Discharge: 2019-06-12 | Disposition: A | Discharge status: Home / Self Care | Payer: Medicaid Other | Source: Ambulatory Visit | Attending: Obstetrics and Gynecology | Admitting: Obstetrics and Gynecology

## 2019-06-12 ENCOUNTER — Other Ambulatory Visit: Payer: Self-pay

## 2019-06-12 ENCOUNTER — Other Ambulatory Visit (HOSPITAL_COMMUNITY): Payer: Self-pay | Admitting: *Deleted

## 2019-06-12 DIAGNOSIS — Z3A29 29 weeks gestation of pregnancy: Secondary | ICD-10-CM

## 2019-06-12 DIAGNOSIS — Z3480 Encounter for supervision of other normal pregnancy, unspecified trimester: Secondary | ICD-10-CM

## 2019-06-12 DIAGNOSIS — O210 Mild hyperemesis gravidarum: Secondary | ICD-10-CM

## 2019-06-12 DIAGNOSIS — Z362 Encounter for other antenatal screening follow-up: Secondary | ICD-10-CM | POA: Diagnosis not present

## 2019-06-12 DIAGNOSIS — O283 Abnormal ultrasonic finding on antenatal screening of mother: Secondary | ICD-10-CM | POA: Diagnosis present

## 2019-06-12 DIAGNOSIS — Z3689 Encounter for other specified antenatal screening: Secondary | ICD-10-CM

## 2019-06-12 DIAGNOSIS — O358XX0 Maternal care for other (suspected) fetal abnormality and damage, not applicable or unspecified: Secondary | ICD-10-CM

## 2019-06-26 ENCOUNTER — Telehealth (INDEPENDENT_AMBULATORY_CARE_PROVIDER_SITE_OTHER): Payer: Medicaid Other | Admitting: Obstetrics

## 2019-06-26 ENCOUNTER — Encounter: Payer: Self-pay | Admitting: Obstetrics

## 2019-06-26 VITALS — BP 114/78

## 2019-06-26 DIAGNOSIS — Z3A31 31 weeks gestation of pregnancy: Secondary | ICD-10-CM

## 2019-06-26 DIAGNOSIS — D509 Iron deficiency anemia, unspecified: Secondary | ICD-10-CM

## 2019-06-26 DIAGNOSIS — Z3480 Encounter for supervision of other normal pregnancy, unspecified trimester: Secondary | ICD-10-CM

## 2019-06-26 DIAGNOSIS — Z8611 Personal history of tuberculosis: Secondary | ICD-10-CM

## 2019-06-26 DIAGNOSIS — O283 Abnormal ultrasonic finding on antenatal screening of mother: Secondary | ICD-10-CM

## 2019-06-26 DIAGNOSIS — O99013 Anemia complicating pregnancy, third trimester: Secondary | ICD-10-CM

## 2019-06-26 NOTE — Progress Notes (Signed)
Pt is on the phone preparing for virtual visit with provider. [redacted]w[redacted]d.  

## 2019-06-26 NOTE — Progress Notes (Signed)
TELEHEALTH OBSTETRICS PRENATAL VIRTUAL VIDEO VISIT ENCOUNTER NOTE  Provider location: [facility] [place]   I connected with Morgan Hartman on 06/26/19 at 10:30 AM EST by OB MyChart Video Encounter at home and verified that I am speaking with the correct person using two identifiers.   I discussed the limitations, risks, security and privacy concerns of performing an evaluation and management service virtually and the availability of in person appointments. I also discussed with the patient that there may be a patient responsible charge related to this service. The patient expressed understanding and agreed to proceed. Subjective:  Morgan Hartman is a 24 y.o. G3P1011 at [redacted]w[redacted]d being seen today for ongoing prenatal care.  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  Patient reports no complaints.  Contractions: Not present. Vag. Bleeding: None.  Movement: Present. Denies any leaking of fluid.   The following portions of the patient's history were reviewed and updated as appropriate: allergies, current medications, past family history, past medical history, past social history, past surgical history and problem list.   Objective:   Vitals:   06/26/19 1019  BP: 114/78    Fetal Status:     Movement: Present     General:  Alert, oriented and cooperative. Patient is in no acute distress.  Respiratory: Normal respiratory effort, no problems with respiration noted  Mental Status: Normal mood and affect. Normal behavior. Normal judgment and thought content.  Rest of physical exam deferred due to type of encounter  Imaging: [place] MFM OB FOLLOW UP  Result Date:  06/12/2019 ----------------------------------------------------------------------  OBSTETRICS REPORT                       (Signed Final 06/12/2019 12:46 pm) ---------------------------------------------------------------------- Patient Info  ID #:       [id number]                          D.O.B.:  11/13/1995 (23 yrs)  Name:       Morgan Hartman                      Visit Date: 06/12/2019 11:00 am ---------------------------------------------------------------------- Performed By  Performed By:     Emeline Darling BS,      Ref. Address:     [address]                    RDMS                                                             Rd                                                             Jacky Kindle  Attending:        Alecia Lemming  Parke Poisson MD         [facility]  Referred By:      [facility]-                    Craige Cotta CNM ---------------------------------------------------------------------- Orders   #  Description                          Code         Ordered By   1  [place] MFM OB FOLLOW UP                  [phone]     Rosana Hoes  ----------------------------------------------------------------------   #  Order #                    Accession #                 Episode #   1  [id number]                  [id number]                  [id number]  ---------------------------------------------------------------------- Indications   Echogenic intracardiac focus of the heart      O35.8XX0   (EIF)   Negative AFP (Negative Horizon)(Low Risk   NIPS)   Encounter for other antenatal screening        Z36.2   follow-up   [redacted] weeks gestation of pregnancy                Z3A.29  ---------------------------------------------------------------------- Fetal Evaluation  Num Of Fetuses:         1  Fetal Heart Rate(bpm):  155  Cardiac Activity:       Observed  Presentation:           Cephalic  Placenta:               Left lateral  P. Cord Insertion:       Previously Visualized  Amniotic Fluid  AFI FV:      Within normal limits  AFI Sum(cm)     %Tile       Largest Pocket(cm)  11.35           23          3.85  RUQ(cm)       RLQ(cm)       LUQ(cm)        LLQ(cm)  3.85          2.95          2.2            2.35 ---------------------------------------------------------------------- Biometry  BPD:      74.8  mm     G. Age:  30w 0d         66  %    CI:        74.86   %    70 - 86  FL/HC:      19.7   %    19.6 - 20.8  HC:      274.3  mm     G. Age:  30w 0d         40  %    HC/AC:      1.05        0.99 - 1.21  AC:      262.1  mm     G. Age:  30w 2d         79  %    FL/BPD:     72.1   %    71 - 87  FL:       53.9  mm     G. Age:  28w 4d         20  %    FL/AC:      20.6   %    20 - 24  Est. FW:    1441  gm      3 lb 3 oz     58  % ---------------------------------------------------------------------- OB History  Gravidity:    3         Term:   1        Prem:   0        SAB:   1  TOP:          0       Ectopic:  0        Living: 1 ---------------------------------------------------------------------- Gestational Age  LMP:           29w 1d        Date:  11/20/18                 EDD:   08/27/19  U/S Today:     29w 5d                                        EDD:   08/23/19  Best:          29w 1d     Det. By:  LMP  (11/20/18)          EDD:   08/27/19 ---------------------------------------------------------------------- Anatomy  Cranium:               Appears normal         LVOT:                   Appears normal  Cavum:                 Appears normal         Aortic Arch:            Previously seen  Ventricles:            Appears normal         Ductal Arch:            Not well visualized  Choroid Plexus:        Previously seen        Diaphragm:              Appears normal  Cerebellum:            Appears normal         Stomach:  Appears normal, left                                                                         sided  Posterior Fossa:       Previously seen        Abdomen:                Appears normal  Nuchal Fold:           Not applicable (>20    Abdominal Wall:         Previously seen                         wks GA)  Face:                  Orbits and profile     Cord Vessels:           Previously seen                         previously seen  Lips:                  Previously seen        Kidneys:                Appear normal  Palate:                Previously seen        Bladder:                Appears normal  Thoracic:              Appears normal         Spine:                  Ltd views no                                                                        intracranial signs of                                                                        NTD  Heart:                 Echogenic focus        Upper Extremities:      Previously seen                         in LV  RVOT:                  Appears normal  Lower Extremities:      Previously seen  Other:  Female gender prev seen.  Technically difficult due to fetal position. ---------------------------------------------------------------------- Doppler - Fetal Vessels  Umbilical Artery   S/D     %tile     RI              PI                     ADFV    RDFV  3.02       56   0.67             1.04                        No      No ---------------------------------------------------------------------- Cervix Uterus Adnexa  Cervix  Not visualized (advanced GA >24wks) ---------------------------------------------------------------------- Comments  This patient was seen for a follow up growth scan due to fetal  growth restriction that was noted during her last ultrasound  exam.  She denies any problems since her last exam.  She was informed that the fetal growth and amniotic fluid  level appears appropriate for her gestational age. The fetus  did not appear to be growth restricted today.  Doppler studies of the umbilical arteries performed today  continues to show  forward diastolic flow.  There were no  signs of absent or reversed end-diastolic flow.  Due to growth restriction noted during her last ultrasound  exam, a follow up exam was scheduled in 4 weeks. ----------------------------------------------------------------------                   Johnell Comings, MD Electronically Signed Final Report   06/12/2019 12:46 pm ----------------------------------------------------------------------   Assessment and Plan:  Pregnancy: [id number] at [redacted]w[redacted]d 1. Supervision of other normal pregnancy, antepartum  2. Fetal echogenic intracardiac focus on prenatal ultrasound  3. Iron deficiency anemia during pregnancy  4. H/O TB (tuberculosis)   Preterm labor symptoms and general obstetric precautions including but not limited to vaginal bleeding, contractions, leaking of fluid and fetal movement were reviewed in detail with the patient. I discussed the assessment and treatment plan with the patient. The patient was provided an opportunity to ask questions and all were answered. The patient agreed with the plan and demonstrated an understanding of the instructions. The patient was advised to call back or seek an in-person office evaluation/go to [facility] at [facility] for any urgent or concerning symptoms. Please refer to After Visit Summary for other counseling recommendations.   I provided 10 minutes of face-to-face time during this encounter.  Return in about 2 weeks (around 07/10/2019) for MyChart.  Future Appointments  Date Time Provider [place]  07/10/2019 10:30 AM Shelly Bombard, MD [place] None  07/11/2019 11:00 AM [place] [facility]  07/11/2019 11:00 AM [facility] [place] 3 [facility]-M[facility]US M[facility]-US    Baltazar Najjar, [place] for [facility] [place] [facility] 06/26/2019

## 2019-07-10 ENCOUNTER — Other Ambulatory Visit: Payer: Self-pay

## 2019-07-10 ENCOUNTER — Telehealth (INDEPENDENT_AMBULATORY_CARE_PROVIDER_SITE_OTHER): Payer: Medicaid Other | Admitting: Obstetrics

## 2019-07-10 ENCOUNTER — Encounter: Payer: Self-pay | Admitting: Obstetrics

## 2019-07-10 VITALS — BP 111/74 | HR 72

## 2019-07-10 DIAGNOSIS — O365921 Maternal care for other known or suspected poor fetal growth, second trimester, fetus 1: Secondary | ICD-10-CM

## 2019-07-10 DIAGNOSIS — Z3A33 33 weeks gestation of pregnancy: Secondary | ICD-10-CM

## 2019-07-10 DIAGNOSIS — Z3480 Encounter for supervision of other normal pregnancy, unspecified trimester: Secondary | ICD-10-CM

## 2019-07-10 DIAGNOSIS — O365920 Maternal care for other known or suspected poor fetal growth, second trimester, not applicable or unspecified: Secondary | ICD-10-CM

## 2019-07-10 DIAGNOSIS — O283 Abnormal ultrasonic finding on antenatal screening of mother: Secondary | ICD-10-CM

## 2019-07-10 NOTE — Progress Notes (Signed)
TELEHEALTH OBSTETRICS PRENATAL VIRTUAL VIDEO VISIT ENCOUNTER NOTE O Provider location: [facility] [employer] at [place]   I connected with Morgan Hartman on 07/10/19 at 10:30 AM EDT by MyChart Video Encounter at home and verified that I am speaking with the correct person using two identifiers.   I discussed the limitations, risks, security and privacy concerns of performing an evaluation and management service virtually and the availability of in person appointments. I also discussed with the patient that there may be a patient responsible charge related to this service. The patient expressed understanding and agreed to proceed. Subjective:  Morgan Hartman is a 24 y.o. G3P1011 at [redacted]w[redacted]d being seen today for ongoing prenatal care.  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  Patient reports no complaints.  Contractions: Not present. Vag. Bleeding: None.  Movement: Present. Denies any leaking of fluid.   The following portions of the patient's history were reviewed and updated as appropriate: allergies, current medications, past family history, past medical history, past social history, past surgical history and problem list.   Objective:   Vitals:   07/10/19 1004  BP: 111/74  Pulse: 72    Fetal Status:     Movement: Present     General:  Alert, oriented and cooperative. Patient is in no acute distress.  Respiratory: Normal respiratory effort, no problems with respiration noted  Mental Status: Normal mood and affect. Normal behavior. Normal judgment and thought content.  Rest of physical exam deferred due to type of encounter  Imaging: [place] MFM OB FOLLOW UP  Result Date:  06/12/2019 ----------------------------------------------------------------------  OBSTETRICS REPORT                       (Signed Final 06/12/2019 12:46 pm) ---------------------------------------------------------------------- Patient Info  ID #:       [id number]                          D.O.B.:  27-Jan-1996 (23 yrs)  Name:       Morgan Hartman                      Visit Date: 06/12/2019 11:00 am ---------------------------------------------------------------------- Performed By  Performed By:     Emeline Darling BS,      Ref. Address:     [address]                    [facility]                                                             Rd                                                             [place]  Attending:  Ma Rings MD         [facility]  Referred By:      Wilmer Floor LEFTWICH-                    Craige Cotta CNM ---------------------------------------------------------------------- Orders   #  Description                          Code         Ordered By   1  [place] MFM OB FOLLOW UP                  [phone]     Rosana Hoes  ----------------------------------------------------------------------   #  Order #                    Accession #                 Episode #   1  [id number]                  [id number]                  [id number]  ---------------------------------------------------------------------- Indications   Echogenic intracardiac focus of the heart      O35.8XX0   (EIF)   Negative AFP (Negative Horizon)(Low Risk   NIPS)   Encounter for other antenatal screening        Z36.2   follow-up   [redacted] weeks gestation of pregnancy                Z3A.29  ---------------------------------------------------------------------- Fetal Evaluation  Num Of Fetuses:         1  Fetal Heart Rate(bpm):  155  Cardiac Activity:       Observed  Presentation:           Cephalic  Placenta:               Left lateral  P. Cord Insertion:       Previously Visualized  Amniotic Fluid  AFI FV:      Within normal limits  AFI Sum(cm)     %Tile       Largest Pocket(cm)  11.35           23          3.85  RUQ(cm)       RLQ(cm)       LUQ(cm)        LLQ(cm)  3.85          2.95          2.2            2.35 ---------------------------------------------------------------------- Biometry  BPD:      74.8  mm     G. Age:  30w 0d         66  %    CI:        74.86   %    70 - 86  FL/HC:      19.7   %    19.6 - 20.8  HC:      274.3  mm     G. Age:  30w 0d         40  %    HC/AC:      1.05        0.99 - 1.21  AC:      262.1  mm     G. Age:  30w 2d         79  %    FL/BPD:     72.1   %    71 - 87  FL:       53.9  mm     G. Age:  28w 4d         20  %    FL/AC:      20.6   %    20 - 24  Est. FW:    1441  gm      3 lb 3 oz     58  % ---------------------------------------------------------------------- OB History  Gravidity:    3         Term:   1        Prem:   0        SAB:   1  TOP:          0       Ectopic:  0        Living: 1 ---------------------------------------------------------------------- Gestational Age  LMP:           29w 1d        Date:  11/20/18                 EDD:   08/27/19  U/S Today:     29w 5d                                        EDD:   08/23/19  Best:          29w 1d     Det. By:  LMP  (11/20/18)          EDD:   08/27/19 ---------------------------------------------------------------------- Anatomy  Cranium:               Appears normal         LVOT:                   Appears normal  Cavum:                 Appears normal         Aortic Arch:            Previously seen  Ventricles:            Appears normal         Ductal Arch:            Not well visualized  Choroid Plexus:        Previously seen        Diaphragm:              Appears normal  Cerebellum:            Appears normal         Stomach:  Appears normal, left                                                                         sided  Posterior Fossa:       Previously seen        Abdomen:                Appears normal  Nuchal Fold:           Not applicable (>20    Abdominal Wall:         Previously seen                         wks GA)  Face:                  Orbits and profile     Cord Vessels:           Previously seen                         previously seen  Lips:                  Previously seen        Kidneys:                Appear normal  Palate:                Previously seen        Bladder:                Appears normal  Thoracic:              Appears normal         Spine:                  Ltd views no                                                                        intracranial signs of                                                                        NTD  Heart:                 Echogenic focus        Upper Extremities:      Previously seen                         in LV  RVOT:                  Appears normal  Lower Extremities:      Previously seen  Other:  Female gender prev seen.  Technically difficult due to fetal position. ---------------------------------------------------------------------- Doppler - Fetal Vessels  Umbilical Artery   S/D     %tile     RI              PI                     ADFV    RDFV  3.02       56   0.67             1.04                        No      No ---------------------------------------------------------------------- Cervix Uterus Adnexa  Cervix  Not visualized (advanced GA >24wks) ---------------------------------------------------------------------- Comments  This patient was seen for a follow up growth scan due to fetal  growth restriction that was noted during her last ultrasound  exam.  She denies any problems since her last exam.  She was informed that the fetal growth and amniotic fluid  level appears appropriate for her gestational age. The fetus  did not appear to be growth restricted today.  Doppler studies of the umbilical arteries performed today  continues to show  forward diastolic flow.  There were no  signs of absent or reversed end-diastolic flow.  Due to growth restriction noted during her last ultrasound  exam, a follow up exam was scheduled in 4 weeks. ----------------------------------------------------------------------                   Johnell Comings, MD Electronically Signed Final Report   06/12/2019 12:46 pm ----------------------------------------------------------------------   Assessment and Plan:  Pregnancy: [id number] at [redacted]w[redacted]d 1. Supervision of High Risk pregnancy, antepartum  2. Fetal echogenic intracardiac focus on prenatal ultrasound - normal genetic screen  3. IUGR (intrauterine growth restriction) affecting care of mother, second trimester, fetus 1 - follow up ultrasound was negative for IUGR at 29 weeks and AFI and Dopplers were normal - another follow up ultrasound is scheduled for tomorrow   Preterm labor symptoms and general obstetric precautions including but not limited to vaginal bleeding, contractions, leaking of fluid and fetal movement were reviewed in detail with the patient. I discussed the assessment and treatment plan with the patient. The patient was provided an opportunity to ask questions and all were answered. The patient agreed with the plan and demonstrated an understanding of the instructions. The patient was advised to call back or seek an in-person office evaluation/go to [facility] at [facility] for any urgent or concerning symptoms. Please refer to After Visit Summary for other counseling recommendations.   I provided 10 minutes of face-to-face time during this encounter.  Return in about 2 weeks (around 07/24/2019) for MyChart.  Future Appointments  Date Time Provider [place]  07/10/2019 10:30 AM Shelly Bombard, MD [place] None  07/11/2019 11:00 AM [place] [facility]-US  07/11/2019 11:00 AM WH-MFC [place] 3 WH-MFCUS MFC-US  07/24/2019 10:30 AM Woodroe Mode, MD [place] None    Baltazar Najjar, [place] for [facility] 07/10/2019

## 2019-07-10 NOTE — Progress Notes (Signed)
S/w pt for virtual visit, pt reports fetal movement and some cramping at times.

## 2019-07-11 ENCOUNTER — Ambulatory Visit (HOSPITAL_COMMUNITY): Payer: Medicaid Other | Admitting: *Deleted

## 2019-07-11 ENCOUNTER — Encounter (HOSPITAL_COMMUNITY): Payer: Self-pay

## 2019-07-11 ENCOUNTER — Other Ambulatory Visit: Payer: Self-pay

## 2019-07-11 ENCOUNTER — Ambulatory Visit (HOSPITAL_COMMUNITY)
Admission: RE | Admit: 2019-07-11 | Discharge: 2019-07-11 | Disposition: A | Discharge status: Home / Self Care | Payer: Medicaid Other | Source: Ambulatory Visit | Attending: Obstetrics and Gynecology | Admitting: Obstetrics and Gynecology

## 2019-07-11 DIAGNOSIS — O283 Abnormal ultrasonic finding on antenatal screening of mother: Secondary | ICD-10-CM

## 2019-07-11 DIAGNOSIS — O210 Mild hyperemesis gravidarum: Secondary | ICD-10-CM

## 2019-07-11 DIAGNOSIS — Z3480 Encounter for supervision of other normal pregnancy, unspecified trimester: Secondary | ICD-10-CM | POA: Diagnosis present

## 2019-07-11 DIAGNOSIS — Z3689 Encounter for other specified antenatal screening: Secondary | ICD-10-CM | POA: Insufficient documentation

## 2019-07-12 DIAGNOSIS — O358XX0 Maternal care for other (suspected) fetal abnormality and damage, not applicable or unspecified: Secondary | ICD-10-CM | POA: Diagnosis not present

## 2019-07-12 DIAGNOSIS — Z362 Encounter for other antenatal screening follow-up: Secondary | ICD-10-CM

## 2019-07-12 DIAGNOSIS — Z3A33 33 weeks gestation of pregnancy: Secondary | ICD-10-CM | POA: Diagnosis not present

## 2019-07-24 ENCOUNTER — Telehealth (INDEPENDENT_AMBULATORY_CARE_PROVIDER_SITE_OTHER): Payer: Medicaid Other | Admitting: Obstetrics & Gynecology

## 2019-07-24 DIAGNOSIS — O283 Abnormal ultrasonic finding on antenatal screening of mother: Secondary | ICD-10-CM | POA: Diagnosis not present

## 2019-07-24 DIAGNOSIS — Z3A35 35 weeks gestation of pregnancy: Secondary | ICD-10-CM

## 2019-07-24 DIAGNOSIS — Z3480 Encounter for supervision of other normal pregnancy, unspecified trimester: Secondary | ICD-10-CM

## 2019-07-24 DIAGNOSIS — O210 Mild hyperemesis gravidarum: Secondary | ICD-10-CM

## 2019-07-24 NOTE — Progress Notes (Signed)
S/w pt for virtual visit, pt reports fetal movement with irregular contractions.

## 2019-07-24 NOTE — Progress Notes (Signed)
   TELEHEALTH VIRTUAL OBSTETRICS VISIT ENCOUNTER NOTE  I connected with Morgan Hartman on 07/24/19 at 10:30 AM EDT by telephone at home and verified that I am speaking with the correct person using two identifiers.   I discussed the limitations, risks, security and privacy concerns of performing an evaluation and management service by telephone and the availability of in person appointments. I also discussed with the patient that there may be a patient responsible charge related to this service. The patient expressed understanding and agreed to proceed.  Subjective:  Morgan Hartman is a 24 y.o. G3P1011 at [redacted]w[redacted]d being followed for ongoing prenatal care.  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  Patient reports no complaints. Reports fetal movement. Denies any contractions, bleeding or leaking of fluid.   The following portions of the patient's history were reviewed and updated as appropriate: allergies, current medications, past family history, past medical history, past social history, past surgical history and problem list.   Objective:   General:  Alert, oriented and cooperative.   Mental Status: Normal mood and affect perceived. Normal judgment and thought content.  Rest of physical exam deferred due to type of encounter  Assessment and Plan:  Pregnancy: G3P1011 at [redacted]w[redacted]d 1. Fetal echogenic intracardiac focus on prenatal ultrasound Benign isolated finding  2. Supervision of other normal pregnancy, antepartum Growth US showed nl  3. Morning sickness resolved  Preterm labor symptoms and general obstetric precautions including but not limited to vaginal bleeding, contractions,  leaking of fluid and fetal movement were reviewed in detail with the patient.  I discussed the assessment and treatment plan with the patient. The patient was provided an opportunity to ask questions and all were answered. The patient agreed with the plan and demonstrated an understanding of the instructions. The patient was advised to call back or seek an in-person office evaluation/go to MAU at [facility] for any urgent or concerning symptoms. Please refer to After Visit Summary for other counseling recommendations.   I provided 10 minutes of non-face-to-face time during this encounter.  Return in about 1 week (around 07/31/2019) for needs GBS.  No future appointments.  Scheryl Darter, MD Center for [facility], [facility]

## 2019-07-24 NOTE — Patient Instructions (Signed)

## 2019-07-31 ENCOUNTER — Ambulatory Visit (INDEPENDENT_AMBULATORY_CARE_PROVIDER_SITE_OTHER): Payer: Medicaid Other | Admitting: Obstetrics & Gynecology

## 2019-07-31 ENCOUNTER — Other Ambulatory Visit: Payer: Self-pay

## 2019-07-31 ENCOUNTER — Other Ambulatory Visit (HOSPITAL_COMMUNITY)
Admission: RE | Admit: 2019-07-31 | Discharge: 2019-07-31 | Disposition: A | Discharge status: Home / Self Care | Payer: Medicaid Other | Source: Ambulatory Visit | Attending: Obstetrics & Gynecology | Admitting: Obstetrics & Gynecology

## 2019-07-31 DIAGNOSIS — Z3480 Encounter for supervision of other normal pregnancy, unspecified trimester: Secondary | ICD-10-CM | POA: Diagnosis present

## 2019-07-31 DIAGNOSIS — Z3A36 36 weeks gestation of pregnancy: Secondary | ICD-10-CM

## 2019-07-31 DIAGNOSIS — Z3483 Encounter for supervision of other normal pregnancy, third trimester: Secondary | ICD-10-CM

## 2019-07-31 NOTE — Progress Notes (Signed)
   PRENATAL VISIT NOTE  Subjective:  Morgan Hartman is a 24 y.o. G3P1011 at [redacted]w[redacted]d being seen today for ongoing prenatal care.  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  Patient reports no complaints.  Contractions: Irregular. Vag. Bleeding: None.  Movement: Present. Denies leaking of fluid.   The following portions of the patient's history were reviewed and updated as appropriate: allergies, current medications, past family history, past medical history, past social history, past surgical history and problem list.   Objective:   Vitals:   07/31/19 1107  BP: 116/78  Pulse: 74  Weight: 175 lb (79.4 kg)    Fetal Status:     Movement: Present     General:  Alert, oriented and cooperative. Patient is in no acute distress.  Skin: Skin is warm and dry. No rash noted.   Cardiovascular: Normal heart rate noted  Respiratory: Normal respiratory effort, no problems with respiration noted  Abdomen: Soft, gravid, appropriate for gestational age.  Pain/Pressure: Present     Pelvic: Cervical exam performed in the presence of a chaperone        Extremities: Normal range of motion.  Edema: None  Mental Status: Normal mood and affect. Normal behavior. Normal judgment and thought content.   Assessment and Plan:  Pregnancy: G3P1011 at 104w1d 1. Supervision of other normal pregnancy, antepartum Doing well, routine testing today  Preterm labor symptoms and general obstetric precautions including but not limited to vaginal bleeding, contractions, leaking of fluid and fetal movement were reviewed in detail with the patient. Please refer to After Visit Summary for other counseling recommendations.    Return in about 1 week (around 08/07/2019) for virtual ok.  No future appointments.  Scheryl Darter, MD

## 2019-07-31 NOTE — Patient Instructions (Signed)

## 2019-08-01 LAB — CERVICOVAGINAL ANCILLARY ONLY
Chlamydia: NEGATIVE
Comment: NEGATIVE
Comment: NORMAL
Neisseria Gonorrhea: NEGATIVE

## 2019-08-02 LAB — STREP GP B NAA: Strep Gp B NAA: NEGATIVE

## 2019-08-07 ENCOUNTER — Telehealth (INDEPENDENT_AMBULATORY_CARE_PROVIDER_SITE_OTHER): Payer: Medicaid Other | Admitting: Obstetrics

## 2019-08-07 ENCOUNTER — Encounter: Payer: Self-pay | Admitting: Obstetrics

## 2019-08-07 DIAGNOSIS — Z3A37 37 weeks gestation of pregnancy: Secondary | ICD-10-CM

## 2019-08-07 DIAGNOSIS — Z3480 Encounter for supervision of other normal pregnancy, unspecified trimester: Secondary | ICD-10-CM

## 2019-08-07 NOTE — Progress Notes (Signed)
Pt states she had a fall yesterday, slipped in doorway and landed on side.   Pt states baby has been moving well, slight cramping, no bleeding or LOF.

## 2019-08-07 NOTE — Progress Notes (Signed)
Subjective:  Morgan Hartman is a 24 y.o. G3P1011 at [redacted]w[redacted]d being seen today for ongoing prenatal care.  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  Patient reports no complaints.  Contractions: Irritability. Vag. Bleeding: None.  Movement: Present. Denies leaking of fluid.   The following portions of the patient's history were reviewed and updated as appropriate: allergies, current medications, past family history, past medical history, past social history, past surgical history and problem list. Problem list updated.  Objective:   Vitals:   08/07/19 1027  BP: 105/67  Pulse: 80    Fetal Status:     Movement: Present     General:  Alert, oriented and cooperative. Patient is in no acute distress.  Skin: Skin is warm and dry. No rash noted.   Cardiovascular: Normal heart rate noted  Respiratory: Normal respiratory effort, no problems with respiration noted  Abdomen: Soft, gravid, appropriate for gestational age. Pain/Pressure: Present     Pelvic:  Cervical exam deferred        Extremities: Normal range of motion.     Mental Status: Normal mood and affect. Normal behavior. Normal judgment and thought content.   Urinalysis:      Assessment and Plan:  Pregnancy: G3P1011 at [redacted]w[redacted]d 1. Supervision of other normal pregnancy, antepartum  Term labor symptoms and general obstetric precautions including but not limited to vaginal bleeding, contractions, leaking of fluid and fetal movement were reviewed in detail with the patient. Please refer to After Visit Summary for other counseling recommendations.   Return in about 1 week (around 08/14/2019) for MyChart.   Brock Bad, MD   08/07/2019

## 2019-08-14 ENCOUNTER — Telehealth (INDEPENDENT_AMBULATORY_CARE_PROVIDER_SITE_OTHER): Payer: Medicaid Other | Admitting: Obstetrics

## 2019-08-14 ENCOUNTER — Encounter: Payer: Self-pay | Admitting: Obstetrics

## 2019-08-14 VITALS — BP 122/80 | HR 72

## 2019-08-14 DIAGNOSIS — O99013 Anemia complicating pregnancy, third trimester: Secondary | ICD-10-CM

## 2019-08-14 DIAGNOSIS — Z3A38 38 weeks gestation of pregnancy: Secondary | ICD-10-CM

## 2019-08-14 DIAGNOSIS — Z3480 Encounter for supervision of other normal pregnancy, unspecified trimester: Secondary | ICD-10-CM

## 2019-08-14 DIAGNOSIS — D509 Iron deficiency anemia, unspecified: Secondary | ICD-10-CM

## 2019-08-14 NOTE — Progress Notes (Signed)
S/w patient for virtual visit, pt reports fetal movement and cramping

## 2019-08-14 NOTE — Progress Notes (Signed)
   OBSTETRICS PRENATAL VIRTUAL VISIT ENCOUNTER NOTE  Provider location: [facility] at [place]   I connected with Morgan Hartman on 08/14/19 at 11:15 AM EDT by MyChart Video Encounter at home and verified that I am speaking with the correct person using two identifiers.   I discussed the limitations, risks, security and privacy concerns of performing an evaluation and management service virtually and the availability of in person appointments. I also discussed with the patient that there may be a patient responsible charge related to this service. The patient expressed understanding and agreed to proceed. Subjective:  Morgan Hartman is a 24 y.o. G3P1011 at [redacted]w[redacted]d being seen today for ongoing prenatal care.  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  Patient reports no complaints.  Contractions: Irritability. Vag. Bleeding: None.  Movement: Present. Denies any leaking of fluid.   The following portions of the patient's history were reviewed and updated as appropriate: allergies, current medications, past family history, past medical history, past social history, past surgical history and problem list.   Objective:   Vitals:   08/14/19 1054  BP: 122/80  Pulse: 72    Fetal Status:     Movement: Present     General:  Alert, oriented and cooperative. Patient is in no acute distress.  Respiratory: Normal respiratory effort, no problems with respiration noted  Mental Status: Normal mood and affect. Normal behavior. Normal judgment and thought content.  Rest of physical exam deferred due to type of encounter  Imaging: No results found.  Assessment and Plan:  Pregnancy:  G3P1011 at [redacted]w[redacted]d 1. Supervision of other normal pregnancy, antepartum   Term labor symptoms and general obstetric precautions including but not limited to vaginal bleeding, contractions, leaking of fluid and fetal movement were reviewed in detail with the patient. I discussed the assessment and treatment plan with the patient. The patient was provided an opportunity to ask questions and all were answered. The patient agreed with the plan and demonstrated an understanding of the instructions. The patient was advised to call back or seek an in-person office evaluation/go to [facility]U at [facility] for any urgent or concerning symptoms. Please refer to After Visit Summary for other counseling recommendations.   I provided 10 minutes of face-to-face time during this encounter.  No follow-ups on file.  Future Appointments  Date Time Provider Department Center  08/14/2019 11:15 AM Brock Bad, MD [facility] None    Coral Ceo, MD [facility] for [facility], [facility] 08/14/2019

## 2019-08-21 ENCOUNTER — Other Ambulatory Visit: Payer: Self-pay

## 2019-08-21 ENCOUNTER — Encounter: Payer: Medicaid Other | Admitting: Obstetrics

## 2019-08-21 ENCOUNTER — Inpatient Hospital Stay (HOSPITAL_COMMUNITY)
Admission: AD | Admit: 2019-08-21 | Discharge: 2019-08-23 | DRG: 807 | Disposition: A | Discharge status: Home / Self Care | Payer: Medicaid Other | Attending: Obstetrics and Gynecology | Admitting: Obstetrics and Gynecology

## 2019-08-21 ENCOUNTER — Encounter (HOSPITAL_COMMUNITY): Payer: Self-pay | Admitting: Obstetrics and Gynecology

## 2019-08-21 DIAGNOSIS — O9902 Anemia complicating childbirth: Secondary | ICD-10-CM | POA: Diagnosis present

## 2019-08-21 DIAGNOSIS — R7611 Nonspecific reaction to tuberculin skin test without active tuberculosis: Secondary | ICD-10-CM | POA: Diagnosis present

## 2019-08-21 DIAGNOSIS — Z3A39 39 weeks gestation of pregnancy: Secondary | ICD-10-CM

## 2019-08-21 DIAGNOSIS — O283 Abnormal ultrasonic finding on antenatal screening of mother: Secondary | ICD-10-CM | POA: Diagnosis present

## 2019-08-21 DIAGNOSIS — Z20822 Contact with and (suspected) exposure to covid-19: Secondary | ICD-10-CM | POA: Diagnosis present

## 2019-08-21 DIAGNOSIS — D509 Iron deficiency anemia, unspecified: Secondary | ICD-10-CM | POA: Diagnosis present

## 2019-08-21 DIAGNOSIS — O26893 Other specified pregnancy related conditions, third trimester: Secondary | ICD-10-CM | POA: Diagnosis present

## 2019-08-21 LAB — TYPE AND SCREEN
ABO/RH(D): O POS
Antibody Screen: NEGATIVE

## 2019-08-21 LAB — ABO/RH: ABO/RH(D): O POS

## 2019-08-21 LAB — RPR: RPR Ser Ql: NONREACTIVE

## 2019-08-21 LAB — CBC
HCT: 39.4 % (ref 36.0–46.0)
Hemoglobin: 12.8 g/dL (ref 12.0–15.0)
MCH: 29.7 pg (ref 26.0–34.0)
MCHC: 32.5 g/dL (ref 30.0–36.0)
MCV: 91.4 fL (ref 80.0–100.0)
Platelets: 237 10*3/uL (ref 150–400)
RBC: 4.31 MIL/uL (ref 3.87–5.11)
RDW: 14.6 % (ref 11.5–15.5)
WBC: 7.5 10*3/uL (ref 4.0–10.5)
nRBC: 0 % (ref 0.0–0.2)

## 2019-08-21 LAB — RESPIRATORY PANEL BY RT PCR (FLU A&B, COVID)
Influenza A by PCR: NEGATIVE
Influenza B by PCR: NEGATIVE
SARS Coronavirus 2 by RT PCR: NEGATIVE

## 2019-08-21 MED ORDER — IBUPROFEN 600 MG PO TABS
600.0000 mg | ORAL_TABLET | Freq: Four times a day (QID) | ORAL | Status: DC
  Administered 2019-08-21 – 2019-08-23 (×8): 600 mg via ORAL
  Filled 2019-08-21 (×8): qty 1

## 2019-08-21 MED ORDER — WITCH HAZEL-GLYCERIN EX PADS: 1.0000 "application " | MEDICATED_PAD | CUTANEOUS | Status: DC | PRN

## 2019-08-21 MED ORDER — DIPHENHYDRAMINE HCL 50 MG/ML IJ SOLN: 12.5000 mg | INTRAMUSCULAR | Status: DC | PRN

## 2019-08-21 MED ORDER — SOD CITRATE-CITRIC ACID 500-334 MG/5ML PO SOLN: 30.0000 mL | ORAL | Status: DC | PRN

## 2019-08-21 MED ORDER — SIMETHICONE 80 MG PO CHEW: 80.0000 mg | CHEWABLE_TABLET | ORAL | Status: DC | PRN

## 2019-08-21 MED ORDER — LIDOCAINE HCL (PF) 1 % IJ SOLN: 30.0000 mL | INTRAMUSCULAR | Status: DC | PRN

## 2019-08-21 MED ORDER — DIPHENHYDRAMINE HCL 25 MG PO CAPS: 25.0000 mg | ORAL_CAPSULE | Freq: Four times a day (QID) | ORAL | Status: DC | PRN

## 2019-08-21 MED ORDER — ONDANSETRON HCL 4 MG PO TABS: 4.0000 mg | ORAL_TABLET | ORAL | Status: DC | PRN

## 2019-08-21 MED ORDER — COCONUT OIL OIL: 1.0000 "application " | TOPICAL_OIL | Status: DC | PRN

## 2019-08-21 MED ORDER — OXYTOCIN 40 UNITS IN NORMAL SALINE INFUSION - SIMPLE MED
1.0000 m[IU]/min | INTRAVENOUS | Status: DC
  Administered 2019-08-21: 2 m[IU]/min via INTRAVENOUS
  Filled 2019-08-21: qty 1000

## 2019-08-21 MED ORDER — MISOPROSTOL 200 MCG PO TABS
ORAL_TABLET | ORAL | Status: AC
  Filled 2019-08-21: qty 5

## 2019-08-21 MED ORDER — BENZOCAINE-MENTHOL 20-0.5 % EX AERO: 1.0000 "application " | INHALATION_SPRAY | CUTANEOUS | Status: DC | PRN

## 2019-08-21 MED ORDER — ONDANSETRON HCL 4 MG/2ML IJ SOLN: 4.0000 mg | INTRAMUSCULAR | Status: DC | PRN

## 2019-08-21 MED ORDER — LACTATED RINGERS IV SOLN: INTRAVENOUS | Status: DC

## 2019-08-21 MED ORDER — ZOLPIDEM TARTRATE 5 MG PO TABS: 5.0000 mg | ORAL_TABLET | Freq: Every evening | ORAL | Status: DC | PRN

## 2019-08-21 MED ORDER — TERBUTALINE SULFATE 1 MG/ML IJ SOLN: 0.2500 mg | Freq: Once | INTRAMUSCULAR | Status: DC | PRN

## 2019-08-21 MED ORDER — ACETAMINOPHEN 325 MG PO TABS: 650.0000 mg | ORAL_TABLET | ORAL | Status: DC | PRN

## 2019-08-21 MED ORDER — DIBUCAINE (PERIANAL) 1 % EX OINT: 1.0000 "application " | TOPICAL_OINTMENT | CUTANEOUS | Status: DC | PRN

## 2019-08-21 MED ORDER — PHENYLEPHRINE 40 MCG/ML (10ML) SYRINGE FOR IV PUSH (FOR BLOOD PRESSURE SUPPORT): 80.0000 ug | PREFILLED_SYRINGE | INTRAVENOUS | Status: DC | PRN

## 2019-08-21 MED ORDER — SENNOSIDES-DOCUSATE SODIUM 8.6-50 MG PO TABS
2.0000 | ORAL_TABLET | ORAL | Status: DC
  Administered 2019-08-21 – 2019-08-22 (×2): 2 via ORAL
  Filled 2019-08-21 (×2): qty 2

## 2019-08-21 MED ORDER — EPHEDRINE 5 MG/ML INJ: 10.0000 mg | INTRAVENOUS | Status: DC | PRN

## 2019-08-21 MED ORDER — ONDANSETRON HCL 4 MG/2ML IJ SOLN
4.0000 mg | Freq: Four times a day (QID) | INTRAMUSCULAR | Status: DC | PRN
  Filled 2019-08-21: qty 2

## 2019-08-21 MED ORDER — FENTANYL CITRATE (PF) 100 MCG/2ML IJ SOLN
100.0000 ug | INTRAMUSCULAR | Status: DC | PRN
  Administered 2019-08-21: 100 ug via INTRAVENOUS
  Filled 2019-08-21: qty 2

## 2019-08-21 MED ORDER — LACTATED RINGERS IV SOLN: 500.0000 mL | Freq: Once | INTRAVENOUS | Status: DC

## 2019-08-21 MED ORDER — OXYTOCIN 40 UNITS IN NORMAL SALINE INFUSION - SIMPLE MED: 2.5000 [IU]/h | INTRAVENOUS | Status: DC

## 2019-08-21 MED ORDER — FENTANYL-BUPIVACAINE-NACL 0.5-0.125-0.9 MG/250ML-% EP SOLN: 12.0000 mL/h | EPIDURAL | Status: DC | PRN

## 2019-08-21 MED ORDER — ACETAMINOPHEN 325 MG PO TABS
650.0000 mg | ORAL_TABLET | ORAL | Status: DC | PRN
  Administered 2019-08-22: 650 mg via ORAL
  Filled 2019-08-21: qty 2

## 2019-08-21 MED ORDER — PRENATAL MULTIVITAMIN CH
1.0000 | ORAL_TABLET | Freq: Every day | ORAL | Status: DC
  Administered 2019-08-22 – 2019-08-23 (×2): 1 via ORAL
  Filled 2019-08-21 (×2): qty 1

## 2019-08-21 MED ORDER — OXYTOCIN BOLUS FROM INFUSION
500.0000 mL | Freq: Once | INTRAVENOUS | Status: AC
  Administered 2019-08-21: 500 mL via INTRAVENOUS

## 2019-08-21 MED ORDER — MISOPROSTOL 200 MCG PO TABS
1000.0000 ug | ORAL_TABLET | Freq: Once | ORAL | Status: AC
  Administered 2019-08-21: 1000 ug via RECTAL

## 2019-08-21 MED ORDER — TETANUS-DIPHTH-ACELL PERTUSSIS 5-2.5-18.5 LF-MCG/0.5 IM SUSP
0.5000 mL | Freq: Once | INTRAMUSCULAR | Status: AC
  Administered 2019-08-23: 0.5 mL via INTRAMUSCULAR
  Filled 2019-08-21: qty 0.5

## 2019-08-21 MED ORDER — LACTATED RINGERS IV SOLN: 500.0000 mL | INTRAVENOUS | Status: DC | PRN

## 2019-08-21 NOTE — Discharge Instructions (Signed)

## 2019-08-21 NOTE — Progress Notes (Addendum)
Patient ID: Antonique Langford, female   DOB: January 03, 1996, 24 y.o.   MRN: [id number] Labor Progress Note Ariane Ditullio is a 24 y.o. G3P1011 at [redacted]w[redacted]d presented for SOL. S: She is doing well. Feeling fetal movement, but not feeling many contractions at this time. Using labor ball. Requesting AROM.   O:  BP 129/88   Pulse 88   Temp 98 F (36.7 C) (Oral)   Resp 16   LMP 11/20/2018 (Exact Date)  EFM: 140/moderate/accels present  CVE: Dilation: 5 Effacement (%): 80 Station: -2 Presentation: Vertex Exam by:: Dr Salomon Mast   A&P: 24 y.o. [id number] [redacted]w[redacted]d by early [place] #Labor: S/p AROM. Expectant management. Consider pitocin if not progressing in 2 hours.  #Pain: Per pt request #FWB: Cat 1 #GBS negative  Dorothyann Gibbs, Medical Student 11:37 AM  GME ATTESTATION:  I saw and evaluated the patient. I agree with the findings and the plan of care as documented in the student's note.  Marlowe Alt, DO OB Fellow, Faculty [facility], Center for [facility] 08/21/2019 12:27 PM

## 2019-08-21 NOTE — MAU Note (Signed)
Pt reports to MAU c/o ctx every 1-2 min. Pt states she noticed some pink spotting when she wiped. Pt endorses +FM. Pt reports when she peed in triage she saw some blood in the toilet that was only pink.

## 2019-08-21 NOTE — Discharge Summary (Signed)
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 yo G3P1011 at 318w1das admitted in Latent Labor on 08/21/2019. Patient had an uncomplicated labor course as follows:   Admitted at 5 cm dilation. Augmented with AROM and pitocin. She progressed to complete and delivered shortly after.  Membrane Rupture Time/Date: 11:34 AM ,08/21/2019   Intrapartum Procedures: Episiotomy: None [1]                                         Lacerations:  Periurethral [8]  Patient had a delivery of a Viable infant. 08/21/2019  Information for the patient's newborn:  ReWillie, Loy0[[id number]]Delivery Method: Vag-Spont     Patient had an uncomplicated postpartum course. She had one elevated BP upon her arrival, but post partum BP were normotensive. She is ambulating, tolerating a regular diet, passing flatus, and urinating well. Patient is discharged home in stable condition on 08/23/19.  Delivery time: 5:02 PM   Magnesium Sulfate received: No BMZ received: No Rhophylac:N/A MMR:No Transfusion:No  Physical exam   Vitals:   08/22/19 0324 08/22/19 0738 08/22/19 1310 08/22/19 2250  BP: 123/85 117/78 118/65 132/89  Pulse: 68 64 60 69  Resp: 16 16 17    Temp: 98.4 F (36.9 C) 98.1 F (36.7 C) 98.1 F (36.7 C) 98.1 F (36.7 C)  TempSrc: Oral  Oral Oral  SpO2: 100% 99%  100%   General: alert, cooperative and no distress Lochia: appropriate Uterine Fundus: firm Incision: N/A DVT Evaluation: No evidence of DVT seen on physical exam. Labs: Lab Results  Component Value Date   WBC 7.5 08/21/2019   HGB 12.8 08/21/2019   HCT 39.4 08/21/2019   MCV 91.4 08/21/2019   PLT 237 08/21/2019   CMP Latest Ref Rng & Units 01/07/2016  Glucose 65 - 99 mg/dL 96  BUN 6 - 20 mg/dL 10  Creatinine 0.44 - 1.00 mg/dL 0.45  Sodium 135 - 145 mmol/L 137  Potassium 3.5 - 5.1 mmol/L 3.7  Chloride 101 - 111 mmol/L 107  CO2 22 - 32 mmol/L 24  Calcium 8.9 - 10.3 mg/dL 9.2   Edinburgh Score: Edinburgh Postnatal Depression Scale Screening Tool 08/22/2019  I have been able to laugh and see the funny side of things. 0  I have looked forward with enjoyment to things. 0  I have blamed myself unnecessarily when things went wrong. 1  I have been anxious or worried for no good reason. 2  I have felt scared or panicky for no good reason. 2  Things have been getting on top of me. 1  I have been so unhappy that I have had difficulty sleeping. 0  I have felt sad or miserable. 0  I have been so unhappy that I have been crying. 0  The thought of harming myself has occurred to me. 0  Edinburgh Postnatal Depression Scale Total 6    Discharge instruction: per After Visit Summary and "Baby and Me Booklet".  After visit meds:  Allergies as of 08/23/2019   No Known Allergies     Medication List    STOP taking these medications   butalbital-acetaminophen-caffeine 50-325-40 MG tablet Commonly known as: FIORICET   calcium carbonate 500 MG chewable tablet Commonly known as: TUMS - dosed in mg elemental calcium   Comfort Fit  Maternity Supp Med Misc   ferrous gluconate 324 MG tablet Commonly known as: FERGON   IRON PO   ondansetron 8 MG disintegrating tablet Commonly known as: ZOFRAN-ODT   pantoprazole 40 MG tablet Commonly known as: Protonix   promethazine 25 MG tablet Commonly known as: PHENERGAN   scopolamine 1 MG/3DAYS Commonly known as: Transderm-Scop (1.5 MG)   terconazole 0.8 % vaginal cream Commonly known as: Terazol 3     TAKE these medications   acetaminophen 325 MG tablet Commonly known as: Tylenol Take 2 tablets (650 mg total) by mouth every 6 (six) hours as needed (for pain scale < 4).   Blood Pressure Kit Devi 1 kit by Does not apply route once a week. Check BP regularly.  Large Cuff.  DX: O09.0   ibuprofen 600 MG tablet Commonly known as: ADVIL Take 1 tablet (600 mg total) by mouth every 6 (six) hours.   loratadine 10 MG tablet Commonly known as: CLARITIN Take 10 mg by mouth daily.   PRENATAL PLUS PO Take 1 tablet by mouth at bedtime.   senna-docusate 8.6-50 MG tablet Commonly known as: Senokot-S Take 2 tablets by mouth daily. Start taking on: August 24, 2019       Diet: routine diet  Activity: Advance as tolerated. Pelvic rest for 6 weeks.   Outpatient follow up:6 weeks Follow up Appt: Future Appointments  Date Time Provider [place]  09/29/2019  9:30 AM Tamala Julian, [place], CNM [facility] None   Follow up Visit:  Please schedule this patient for Postpartum visit in: 6 weeks with the following provider: Any provider Virtual For C/S patients schedule nurse incision check in weeks 2 weeks: no Low risk pregnancy complicated by: None Delivery mode:  SVD Anticipated Birth Control:  other/unsure PP Procedures needed: None  Schedule Integrated BH visit: no     Newborn Data: Live born female  Birth Weight: 3140g  APGAR (1 MIN): 9   APGAR (5 MINS): 10   APGAR (10 MINS):    Newborn Delivery   Birth date/time: 08/21/2019 17:02:00 Delivery type: Vaginal,  Spontaneous      Baby Feeding: Breast Disposition:home with mother   08/23/2019 Chauncey Mann, MD

## 2019-08-21 NOTE — Lactation Note (Addendum)
This note was copied from a baby's chart. Lactation Consultation Note  Patient Name: Morgan Hartman [id number] Date: 08/21/2019 Reason for consult: Initial assessment Baby 5hrs old, mom nursing baby in side lying position L breast, reports baby has been latched and nursing for 1hr. Reports difficulty latching baby and wonders if baby has a tongue tie, denies h/o tongue tie with first baby, self or FOB. Discussed cross cradle or football as best position for newborn. Advised skin to skin, mom up to sitting position, assisted with latching baby to R. breast in cross-cradle, audible swallows noted. Mom taught hand expression by LC, drops easily expressed from left breast. Advised feed baby per cue 8-12 times in 24hrs, call if needs help latching with next feeding or with hand expression. Mom reports plans to exclusively BF, reviewed lactogenesis II, normal newborn behavior, cluster feeding, size of baby stomach, and feeding cues. Breastfeeding brochure reviewed and given. Mom voiced understanding and with no further concerns. Left room with baby nursing from right breast at 23 minute mark.  Maternal Data Formula Feeding for Exclusion: No Has patient been taught Hand Expression?: Yes Does the patient have breastfeeding experience prior to this delivery?: Yes  Feeding Feeding Type: Breast Fed  LATCH Score Latch: Grasps breast easily, tongue down, lips flanged, rhythmical sucking.  Audible Swallowing: A few with stimulation  Type of Nipple: Everted at rest and after stimulation  Comfort (Breast/Nipple): Soft / non-tender  Hold (Positioning): Assistance needed to correctly position infant at breast and maintain latch.  LATCH Score: 8  Interventions Interventions: Breast feeding basics reviewed;Assisted with latch;Skin to skin;Hand express;Position options;Expressed milk  Lactation Tools Discussed/Used WIC Program: Yes   Consult Status Consult Status: Follow-up Date: 08/22/19 Follow-up  type: In-patient    Charlynn Court 08/21/2019, 10:32 PM

## 2019-08-21 NOTE — H&P (Addendum)
OBSTETRIC ADMISSION HISTORY AND PHYSICAL  Morgan Hartman is a 24 y.o. female G62P1011 with IUP at 61w1dby LMP/early [place] for SOL. She reports +FMs, No LOF, no VB.  She plans on breast feeding. She is undecided for birth control, but is thinking of a short-acting option like pills. She received her prenatal care at [facility].    Dating: By 8.2 week [place]--->  Estimated Date of Delivery: 08/27/19  Sono:   _0 , CWD, normal anatomy, cephalic presentation, 22482N 54% EFW   Prenatal History/Complications:  -Echogenic intracardiac focus of the heart seen on early [place] resolved.  -Tuberculosis- Positive PPD  Past Medical History: Past Medical History:  Diagnosis Date   Headache    Kidney calculus 2017   Susceptible to varicella (non-immune), currently pregnant 04/30/2016   Will offer pp   Tuberculosis    Tested POS.  Chest XRAY Negative    Past Surgical History: Past Surgical History:  Procedure Laterality Date   NO PAST SURGERIES      Obstetrical History: OB History     Gravida  3   Para  1   Term  1   Preterm  0   AB  1   Living  1      SAB  1   TAB  0   Ectopic  0   Multiple  0   Live Births  1           Social History Social History   Socioeconomic History   Marital status: Married    Spouse name: Not on file   Number of children: 1   Years of education: Not on file   Highest education level: Not on file  Occupational History   Not on file  Tobacco Use   Smoking status: Never Smoker   Smokeless tobacco: Never Used  Substance and Sexual Activity   Alcohol use: Not Currently    Comment: occ.   Drug use: No   Sexual activity: Yes  Other Topics Concern   Not on file  Social History Narrative   ** Merged History Encounter **       Social Determinants of Health   Financial Resource Strain:    Difficulty of Paying Living Expenses:   Food Insecurity:    Worried About RCharity fundraiserin the Last Year:    RArboriculturistin the  Last Year:   Transportation Needs:    LFilm/video editor(Medical):    Lack of Transportation (Non-Medical):   Physical Activity:    Days of Exercise per Week:    Minutes of Exercise per Session:   Stress:    Feeling of Stress :   Social Connections:    Frequency of Communication with Friends and Family:    Frequency of Social Gatherings with Friends and Family:    Attends Religious Services:    Active Member of Clubs or Organizations:    Attends CMusic therapist    Marital Status:     Family History: Family History  Problem Relation Age of Onset   Diabetes Maternal Grandmother    Arthritis Maternal Grandmother    Miscarriages / Stillbirths Maternal Grandmother    Depression Maternal Grandfather    Diabetes Paternal Grandmother    Heart attack Paternal Grandmother    Arthritis Paternal Grandmother    Heart disease Paternal Grandmother    Stroke Paternal Grandfather     Allergies: No Known Allergies  Medications Prior to Admission  Medication Sig Dispense Refill Last Dose   butalbital-acetaminophen-caffeine (FIORICET) 50-325-40 MG tablet Take 1-2 tablets by mouth every 6 (six) hours as needed for headache. 20 tablet 0 Past Month at Unknown time   Ferrous Sulfate (IRON PO) Take by mouth.   08/20/2019 at Unknown time   loratadine (CLARITIN) 10 MG tablet Take 10 mg by mouth daily.   08/20/2019 at Unknown time   Prenatal Vit-Fe Fumarate-FA (PRENATAL PLUS PO) Take 1 tablet by mouth at bedtime.    08/20/2019 at Unknown time   Blood Pressure Monitoring (BLOOD PRESSURE KIT) DEVI 1 kit by Does not apply route once a week. Check BP regularly.  Large Cuff.  DX: O09.0 1 kit 0 Unknown at Unknown time   calcium carbonate (TUMS - DOSED IN MG ELEMENTAL CALCIUM) 500 MG chewable tablet Chew 1 tablet by mouth 2 (two) times daily as needed for indigestion or heartburn.    Unknown at Unknown time   Elastic Bandages & Supports (COMFORT FIT MATERNITY SUPP MED) MISC 1 Device by Does  not apply route daily. 1 each 0    ferrous gluconate (FERGON) 324 MG tablet Take 1 tablet (324 mg total) by mouth daily with breakfast. 30 tablet 3 Unknown at Unknown time   ondansetron (ZOFRAN-ODT) 8 MG disintegrating tablet DISSOLVE 1 TABLET(8 MG) ON THE TONGUE EVERY 8 HOURS AS NEEDED FOR NAUSEA OR VOMITING (Patient not taking: Reported on 05/01/2019) 20 tablet 1    pantoprazole (PROTONIX) 40 MG tablet Take 1 tablet (40 mg total) by mouth daily. (Patient not taking: Reported on 05/01/2019) 30 tablet 5    promethazine (PHENERGAN) 25 MG tablet Take 1 tablet (25 mg total) by mouth every 6 (six) hours as needed for nausea or vomiting. May insert vaginally, if unable to keep anything down 30 tablet 3    scopolamine (TRANSDERM-SCOP, 1.5 MG,) 1 MG/3DAYS Place 1 patch (1.5 mg total) onto the skin every 3 (three) days. (Patient not taking: Reported on 05/01/2019) 10 patch 0    terconazole (TERAZOL 3) 0.8 % vaginal cream Place 1 applicator vaginally at bedtime. (Patient not taking: Reported on 03/06/2019) 20 g 0      Review of Systems   All systems reviewed and negative except as stated in HPI  Blood pressure 129/88, pulse 88, temperature 98 F (36.7 C), temperature source Oral, resp. rate 16, last menstrual period 11/20/2018, currently breastfeeding. General appearance: alert, cooperative and no distress Fetal monitoringBaseline: 130 bpm, Variability: Good {> 6 bpm) and Accelerations: Reactive Uterine activity: Present, irregular, occurring every 2-3 min Dilation: 5 Effacement (%): 80 Exam by:: Maryagnes Amos RN   Prenatal labs: ABO, Rh: O/Positive/-- (10/12 1103) Antibody: Negative (10/12 1103) Rubella: 1.94 (10/12 1103) RPR: Non Reactive (02/01 1021)  HBsAg: Negative (10/12 1103)  HIV: Non Reactive (02/01 1021)  GBS: Negative/-- (04/05 1129)  1 hr Glucola: 149 Genetic screening: Low risk Anatomy US: Echogenic focus of heart noted, resolved on later scans.   Prenatal Transfer Tool  Maternal  Diabetes: No Genetic Screening: Normal Maternal Ultrasounds/Referrals: Normal Fetal Ultrasounds or other Referrals:  None Maternal Substance Abuse:  No Significant Maternal Medications:  None Significant Maternal Lab Results: Group B Strep negative  Results for orders placed or performed during the hospital encounter of 08/21/19 (from the past 24 hour(s))  CBC   Collection Time: 08/21/19  7:18 AM  Result Value Ref Range   WBC 7.5 4.0 - 10.5 K/uL   RBC 4.31 3.87 - 5.11 MIL/uL   Hemoglobin 12.8 12.0 -  15.0 g/dL   HCT 39.4 36.0 - 46.0 %   MCV 91.4 80.0 - 100.0 fL   MCH 29.7 26.0 - 34.0 pg   MCHC 32.5 30.0 - 36.0 g/dL   RDW 14.6 11.5 - 15.5 %   Platelets 237 150 - 400 K/uL   nRBC 0.0 0.0 - 0.2 %    Patient Active Problem List   Diagnosis Date Noted   [redacted] weeks gestation of pregnancy 08/21/2019   Labor and delivery, indication for care 08/21/2019   Iron deficiency anemia during pregnancy 05/30/2019   Fetal echogenic intracardiac focus on prenatal ultrasound 04/24/2019   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 G3P1011 at 41w1dhere for SOL  #Labor: Cervix at 5/80 upon admission, expectant management. Consider AROM if augmentation is needed. #Pain: Per pt. request. #FWB: Category 1 FHR, vertex #ID: GBS neg #MOF: breast #MOC: Undecided  BPearla Dubonnet Medical Student  08/21/2019, 8:35 AM  GME ATTESTATION:  I saw and evaluated the patient. I agree with the findings and the plan of care as documented in the student's note.  HMerilyn Baba DO OB Fellow, [place]4/26/2021 10:18 AM

## 2019-08-22 LAB — PROTEIN / CREATININE RATIO, URINE
Creatinine, Urine: 33.37 mg/dL
Protein Creatinine Ratio: 0.27 mg/mg{Cre} — ABNORMAL HIGH (ref 0.00–0.15)
Total Protein, Urine: 9 mg/dL

## 2019-08-22 NOTE — Progress Notes (Addendum)
POSTPARTUM PROGRESS NOTE  Subjective: Morgan Hartman is a 24 y.o. [id number] s/p NSVD at [redacted]w[redacted]d.  She reports she doing well. No acute events overnight. She denies any problems with ambulating, voiding or po intake. Denies nausea or vomiting. She has passed flatus. Pain is moderately controlled.  Lochia is normal. She does complain of lower abdominal pain during breastfeeding that resolves once breastfeeding stops.   Objective: Blood pressure 123/85, pulse 68, temperature 98.4 F (36.9 C), temperature source Oral, resp. rate 16, last menstrual period 11/20/2018, SpO2 100 %, currently breastfeeding.  Physical Exam:  General: alert, cooperative and no distress Chest: no respiratory distress Abdomen: soft, non-tender  Uterine Fundus: firm, appropriately tender Extremities: No calf swelling or tenderness  No edema  Recent Labs    08/21/19 0718  HGB 12.8  HCT 39.4    Assessment/Plan: Morgan Hartman is a 24 y.o. [id number] s/p SVD at [redacted]w[redacted]d for SOL.  Routine Postpartum Care: Doing well, pain well-controlled.  -- Continue routine care, lactation support  -- Contraception: Unsure still -- Feeding: Breast  Dispo: Plan for discharge 4/28 AM.  Dorothe Pea, DO, PGY1  I saw and evaluated the patient. I agree with the findings and the plan of care as documented in the resident's note. Patient with elevated BP's while pushing but normal post-partum thus far, will cont to monitor.  Jerilynn Birkenhead, MD [facility] Family Medicine Fellow, [facility] for [employer], [facility]

## 2019-08-22 NOTE — Lactation Note (Signed)
This note was copied from a baby's chart. Lactation Consultation Note  Patient Name: Morgan Hartman [id number] Date: 08/22/2019    Infant is 20 hrs old & has already had 4 voids & 4 stools. Mom reported that she had a tiny blister from infant's latch, but when I looked at her breasts, Mom reported that it had disappeared. I did note that she has a mild compression stripe on her L nipple while her R nipple is atraumatic.   Mom had thought that she needed to get her entire areola into the baby's mouth. I showed her the specifics of an asymmetric latch from the [facility] website animation which also depicts how much areola to expect infant to cover.  It also sounded as if Mom needed assistance with how to manage infant's head position during feeding, so I reviewed appropriate hand placement & pressure.  I asked Mom if there was anything I could do for her (infant was sleeping in the bassinet). Mom reported that she will call me for assist with feeding if infant is ready to feed again before the end of my shift.   [name] [facility] 08/22/2019, 1:21 PM

## 2019-08-23 MED ORDER — IBUPROFEN 600 MG PO TABS: 600.0000 mg | ORAL_TABLET | Freq: Four times a day (QID) | ORAL | 0 refills | Status: AC

## 2019-08-23 MED ORDER — SENNOSIDES-DOCUSATE SODIUM 8.6-50 MG PO TABS: 2.0000 | ORAL_TABLET | ORAL | 0 refills | Status: DC

## 2019-08-23 MED ORDER — ACETAMINOPHEN 325 MG PO TABS: 650.0000 mg | ORAL_TABLET | Freq: Four times a day (QID) | ORAL | 0 refills | Status: AC | PRN

## 2019-08-27 ENCOUNTER — Inpatient Hospital Stay (HOSPITAL_COMMUNITY): Admission: RE | Admit: 2019-08-27 | Payer: Medicaid Other | Source: Home / Self Care

## 2019-09-29 ENCOUNTER — Telehealth (HOSPITAL_BASED_OUTPATIENT_CLINIC_OR_DEPARTMENT_OTHER): Payer: Medicaid Other | Admitting: Advanced Practice Midwife

## 2019-09-29 DIAGNOSIS — O99345 Other mental disorders complicating the puerperium: Secondary | ICD-10-CM

## 2019-09-29 DIAGNOSIS — A159 Respiratory tuberculosis unspecified: Secondary | ICD-10-CM

## 2019-09-29 DIAGNOSIS — F53 Postpartum depression: Secondary | ICD-10-CM

## 2019-09-29 DIAGNOSIS — R7611 Nonspecific reaction to tuberculin skin test without active tuberculosis: Secondary | ICD-10-CM

## 2019-09-29 DIAGNOSIS — O9089 Other complications of the puerperium, not elsewhere classified: Secondary | ICD-10-CM

## 2019-09-29 NOTE — Progress Notes (Signed)
I connected with@ on 10/04/19 at  9:30 AM EDT by: Mychart video and verified that I am speaking with the correct person using two identifiers.  Patient is located at home and provider is located at [employer] for [employer] at [place] .     The purpose of this virtual visit is to provide medical care while limiting exposure to the novel coronavirus. I discussed the limitations, risks, security and privacy concerns of performing an evaluation and management service by video and the availability of in person appointments. I also discussed with the patient that there may be a patient responsible charge related to this service. By engaging in this virtual visit, you consent to the provision of healthcare.  Additionally, you authorize for your insurance to be billed for the services provided during this visit.  The patient expressed understanding and agreed to proceed.  The following staff members participated in the virtual visit:  None  Post Partum Visit Note Subjective:   Morgan Hartman is a 23 y.o. G31P2012 female who presents for a postpartum visit. She is 6 weeks postpartum following a normal spontaneous vaginal delivery.  I have fully reviewed the prenatal and intrapartum course. The delivery was at 39.1 gestational weeks.  Anesthesia: none. Postpartum course has been uncomplicated. Baby is doing well. Baby is feeding by breast. Bleeding no bleeding. Bowel function is normal. Bladder function is normal. Patient is not sexually active. Contraception method is wants to discuss birth control today.. Postpartum depression screening: positive.  The following portions of the patient's history were reviewed and updated as appropriate: allergies, current medications, past family history, past medical history, past social history, past surgical history and problem list.  Review of Systems Pertinent items are noted in HPI.   Objective:   Vitals:   09/29/19 0903  BP: 104/69  Pulse: 86   Self-Obtained       Pt in NAB, tired-appearing Edinburgh Postnatal Depression Scale Screening Tool 09/29/2019 08/22/2019 08/21/2019 11/26/2016  I have been able to laugh and see the funny side of things. 0 0 (No Data) 0  I have looked forward with enjoyment to things. 1 0 - 0  I have blamed myself unnecessarily when things went wrong. 0 1 - 0  I have been anxious or worried for no good reason. 2 2 - 0  I have felt scared or panicky for no good reason. 2 2 - 0  Things have been getting on top of me. 2 1 - 0  I have been so unhappy that I have had difficulty sleeping. 0 0 - 0  I have felt sad or miserable. 2 0 - 0  I have been so unhappy that I have been crying. 2 0 - 0  The thought of harming myself has occurred to me. 0 0 - 0  Edinburgh Postnatal Depression Scale Total 11 6 - 0      Assessment:    Nml postpartum exam.  Plan:  Essential components of care per ACOG recommendations:  1.  Mood and well being: Patient with positive depression screening today. Reviewed local resources for support. Offered support and referral to IBH. Pt declines. States she has good support. PPD precautions reviewed.  - Patient does not use tobacco.  - hx of drug use? No  2. Infant care and feeding:  -Patient currently breastmilk feeding? Yes Pt not returning to work. Offer LC PRN. No need now per pt. Reviewed importance of draining breast regularly to support lactation. -Social  determinants of health (SDOH) reviewed in EPIC. No concerns. The following needs were identified: None   3. Sexuality, contraception and birth spacing - Patient does not want a pregnancy in the next year.  Desired family size is unsure.  - Reviewed forms of contraception in tiered fashion. Patient desired condoms today.   - Discussed birth spacing of 18 months  4. Sleep and fatigue -Encouraged family/partner/community support of 4 hrs of uninterrupted sleep to help with mood and fatigue  5. Physical Recovery  - Discussed  patients delivery and complications - Patient had a right periurethral degree laceration, perineal healing reviewed. Patient expressed understanding - Patient has urinary incontinence? No - Patient is safe to resume physical and sexual activity  6.  Health Maintenance - Last pap smear done 2020 and was normal with absent transformation zone. Per ASCCP for pt 21-29 w/ out transformation zone routine age-based screening should continue. Next Pap due 2023. Mammogram not indicated  7. Chronic Disease - Hx pos PPD w/ out Sx. Pt needs Chest X-ray PP for MFM.  - PCP follow up annually  12 minutes of non-face-to-face time spent with the patient    Dorathy Kinsman, CNM Center for [employer], [facility]

## 2019-10-04 ENCOUNTER — Other Ambulatory Visit (INDEPENDENT_AMBULATORY_CARE_PROVIDER_SITE_OTHER): Payer: Medicaid Other | Admitting: Advanced Practice Midwife

## 2019-10-04 DIAGNOSIS — Z9289 Personal history of other medical treatment: Secondary | ICD-10-CM

## 2019-10-04 NOTE — Progress Notes (Signed)
Pt need Chest X-ray for Hx pos PPD.

## 2019-11-08 ENCOUNTER — Ambulatory Visit
Admission: RE | Admit: 2019-11-08 | Discharge: 2019-11-08 | Disposition: A | Discharge status: Home / Self Care | Payer: No Typology Code available for payment source | Source: Ambulatory Visit | Attending: Obstetrics and Gynecology | Admitting: Obstetrics and Gynecology

## 2019-11-08 ENCOUNTER — Other Ambulatory Visit: Payer: Self-pay | Admitting: Obstetrics and Gynecology

## 2019-11-08 DIAGNOSIS — R7611 Nonspecific reaction to tuberculin skin test without active tuberculosis: Secondary | ICD-10-CM

## 2021-01-26 IMAGING — US US OB < 14 WEEKS - US OB TV
1 series · 15 of 28 positions shown · non-contrast
Comparison: None.

CLINICAL DATA: Abdominal pain with bleeding

EXAM:
OBSTETRIC <14 WK US AND TRANSVAGINAL OB US
TECHNIQUE: Both transabdominal and transvaginal ultrasound examinations were
performed for complete evaluation of the gestation as well as the
maternal uterus, adnexal regions, and pelvic cul-de-sac.
Transvaginal technique was performed to assess early pregnancy.

[Series 1: us ob < 14 weeks - us ob tv · 75 acquisitions, 15 frames shown]
[im 1/75]
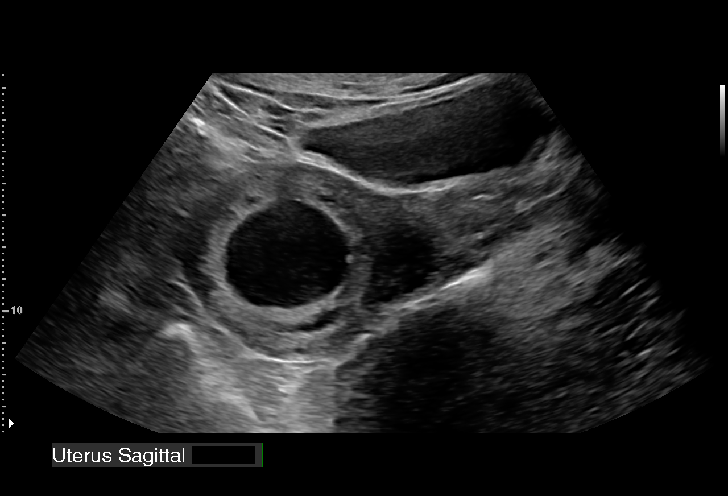
[im 6/75]
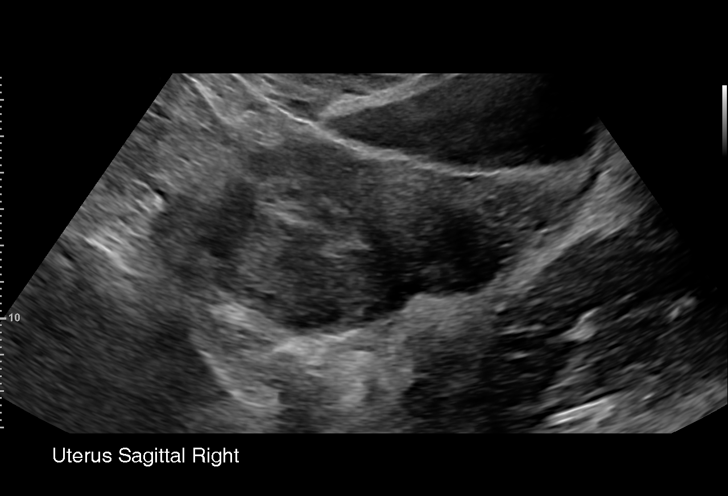
[im 11/75]
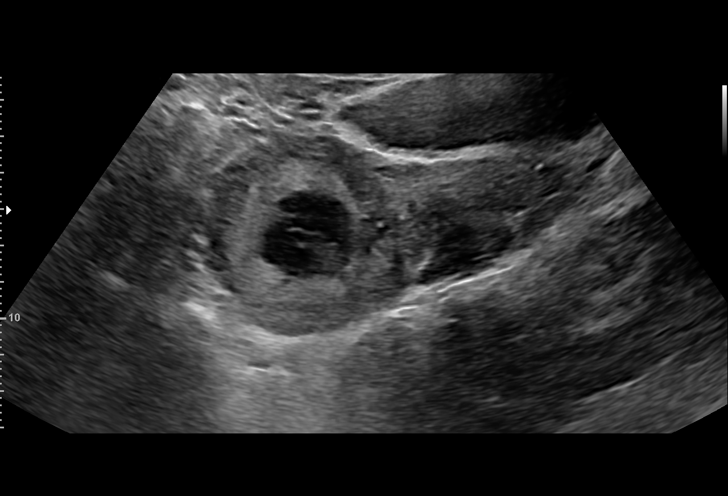
[im 17/75]
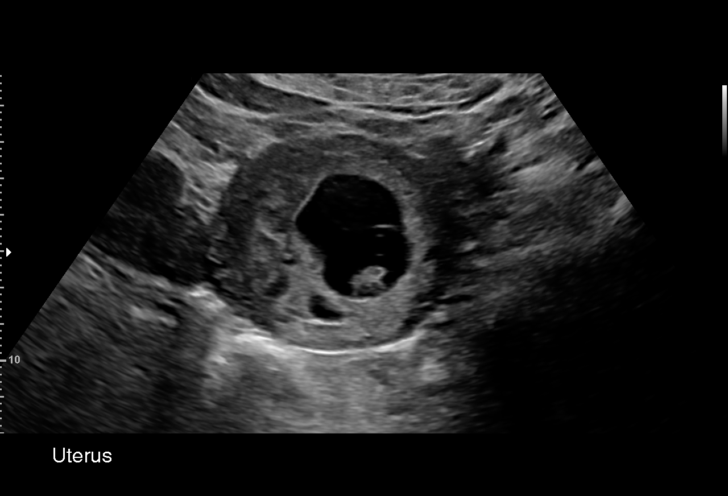
[im 22/75]
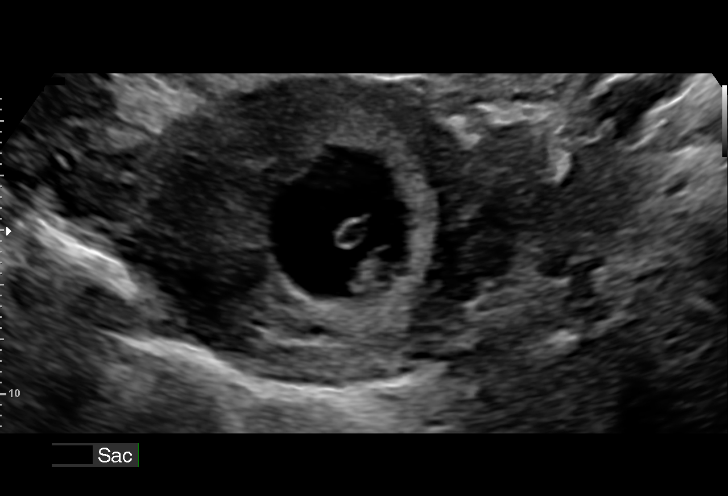
[im 28/75]
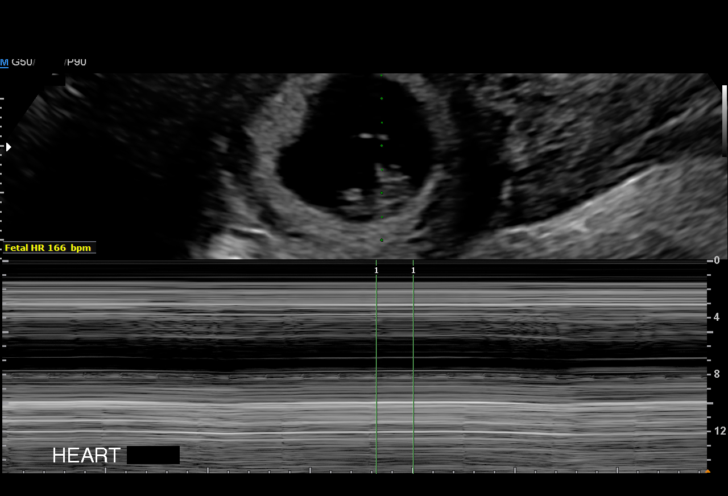
[im 33/75]
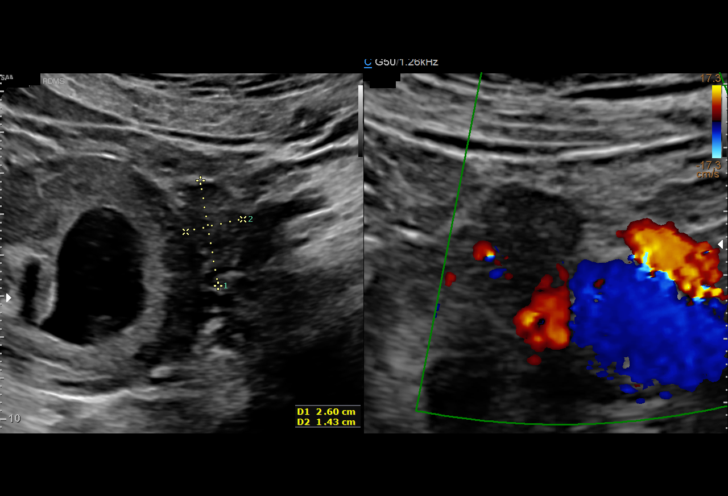
[im 39/75]
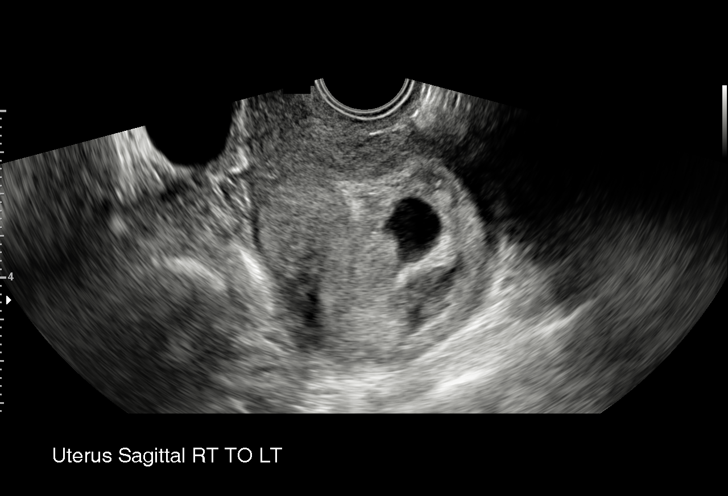
[im 42/75]
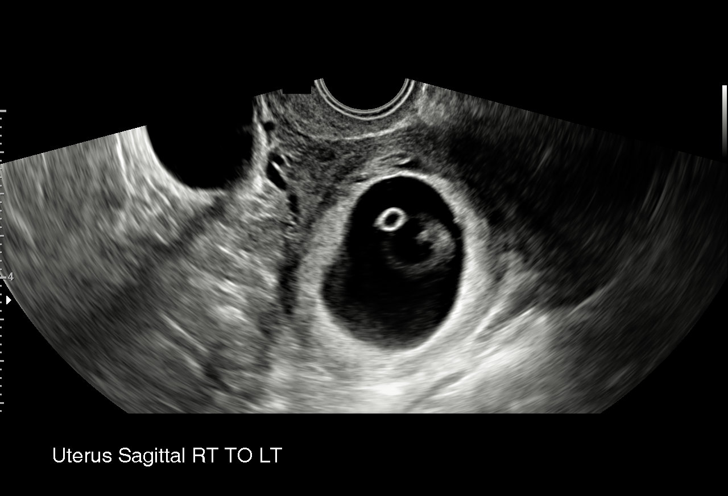
[im 47/75]
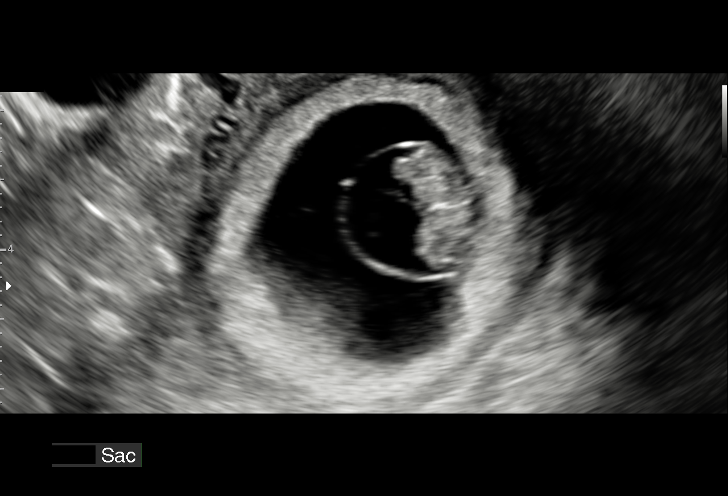
[im 53/75]
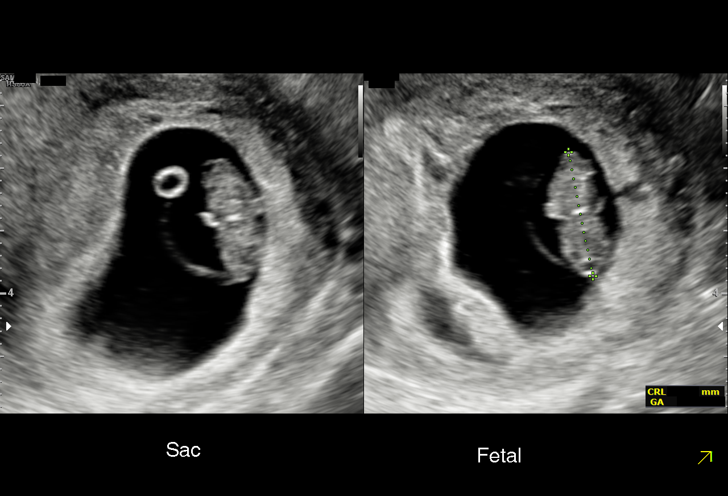
[im 58/75]
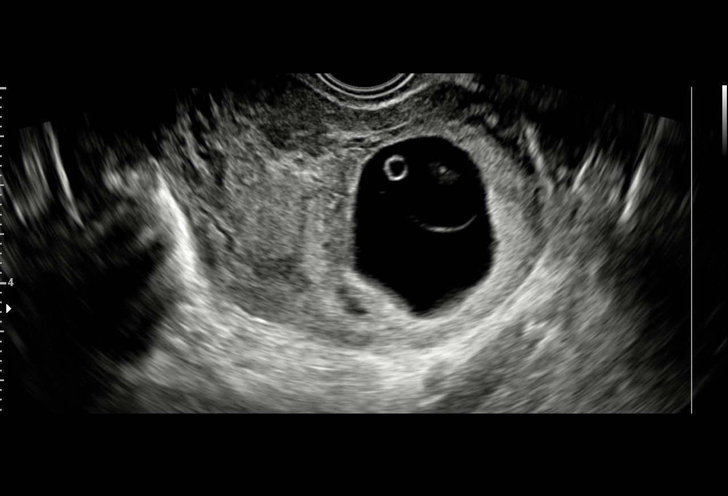
[im 64/75]
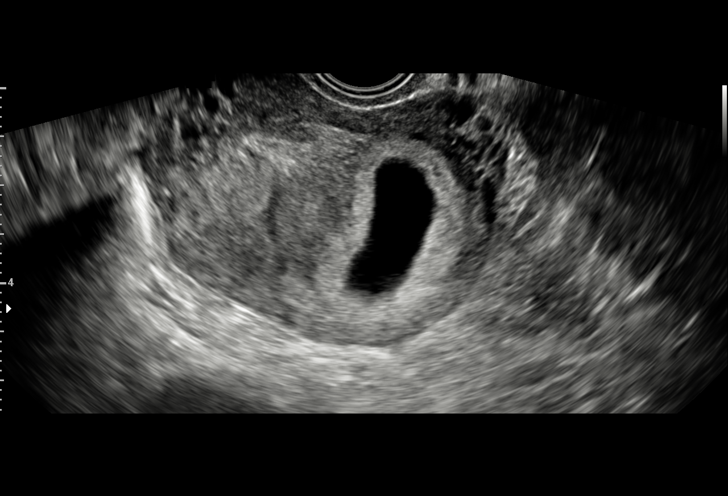
[im 69/75]
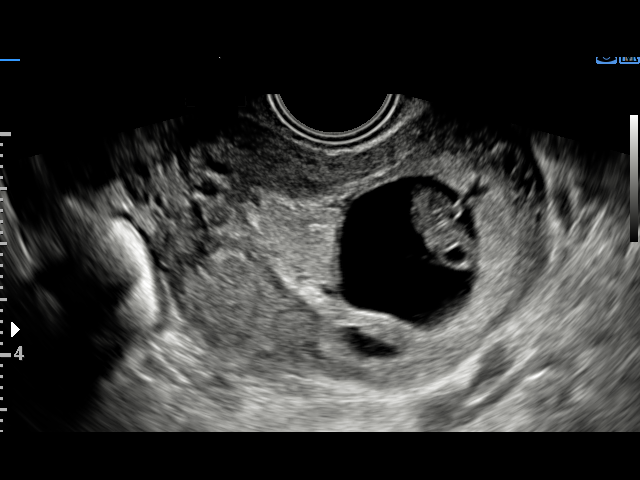
[im 75/75]
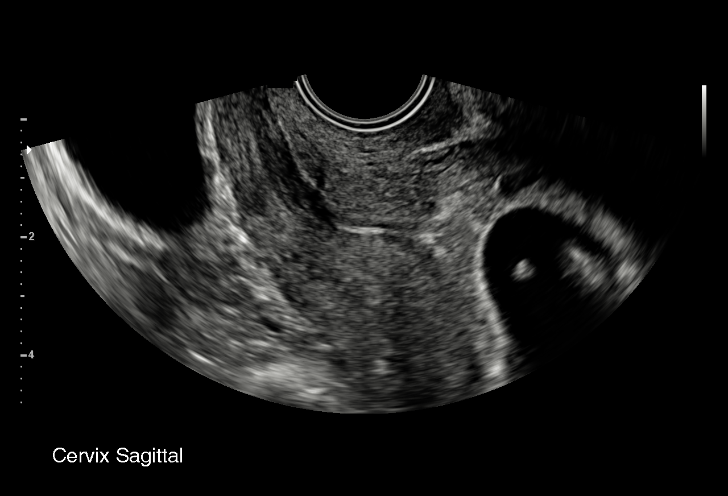

[15 of 28 positions shown; findings below may reference images not displayed]

FINDINGS: Intrauterine gestational sac: Single

Yolk sac:  Visualized.

Embryo:  Visualized.

Cardiac Activity: Visualized.

Heart Rate: 168 bpm

CRL: 19 mm   8 w   2 d                  US EDC: 08/31/2019

Subchorionic hemorrhage: Small subchorionic hemorrhage along the
right posterior sac.

Maternal uterus/adnexae: Ovaries are within normal limits. Right
ovary measures 4.2 x 2.6 x 3.2 cm with a volume of 18 mL. Left ovary
measures 2.6 x 1.4 x 2 cm with a volume of 3.7 mL. No significant
free fluid
IMPRESSION: 1. Single viable intrauterine pregnancy as above.
2. Small subchorionic hemorrhage.

## 2022-04-27 DIAGNOSIS — A539 Syphilis, unspecified: Secondary | ICD-10-CM

## 2022-04-27 HISTORY — DX: Syphilis, unspecified: A53.9

## 2024-04-04 ENCOUNTER — Encounter: Payer: Self-pay | Admitting: Obstetrics and Gynecology

## 2024-04-04 ENCOUNTER — Ambulatory Visit: Admitting: Obstetrics and Gynecology

## 2024-04-04 ENCOUNTER — Other Ambulatory Visit (HOSPITAL_COMMUNITY)
Admission: RE | Admit: 2024-04-04 | Discharge: 2024-04-04 | Disposition: A | Discharge status: Home / Self Care | Source: Ambulatory Visit | Attending: Obstetrics and Gynecology | Admitting: Obstetrics and Gynecology

## 2024-04-04 VITALS — BP 115/76 | HR 87 | Ht 65.0 in | Wt 177.0 lb

## 2024-04-04 DIAGNOSIS — Z23 Encounter for immunization: Secondary | ICD-10-CM

## 2024-04-04 DIAGNOSIS — Z01419 Encounter for gynecological examination (general) (routine) without abnormal findings: Secondary | ICD-10-CM | POA: Diagnosis not present

## 2024-04-04 DIAGNOSIS — N941 Unspecified dyspareunia: Secondary | ICD-10-CM | POA: Diagnosis not present

## 2024-04-04 NOTE — Patient Instructions (Addendum)
 It was nice meeting you today! You will see your results in the MyChart app within 1 week  Pelvic floor stretches: [email or web address]

## 2024-04-04 NOTE — Progress Notes (Signed)
New GYN presents for AEX/PAP/STD screening.

## 2024-04-04 NOTE — Progress Notes (Signed)
 ANNUAL EXAM Patient name: Morgan Hartman MRN [id number]  Date of birth: 1995-12-11 Chief Complaint:   New Patient (Initial Visit) (GYN)  History of Present Illness:   Morgan Hartman is a 28 y.o. [phone] with Patient's last menstrual period was 04/04/2024 (exact date). being seen today for a routine annual exam.  Current complaints:  Painful intercourse. Positional, deep; non-insertional. Uses lubricant    Upstream - 04/04/24 1121       Pregnancy Intention Screening   Does the patient want to become pregnant in the next year? No    Would the patient like to discuss contraceptive options today? No      Contraception Wrap Up   Current Method Female Condom    End Method Female Condom    Contraception Counseling Provided No    How was the end contraceptive method provided? N/A         The pregnancy intention screening data noted above was reviewed. Potential methods of contraception were discussed. The patient elected to proceed with Female Condom.   Last pap 05/30/2018. Results were: NILM. H/O abnormal pap: no Last mammogram: n/a Family h/o breast cancer: no Last colonoscopy: n/a Family h/o colorectal cancer: no HPV vaccine: unsure     02/07/2014    9:19 AM  Depression screen PHQ 2/9  Decreased Interest 0  Down, Depressed, Hopeless 0  PHQ - 2 Score 0        02/06/2019    9:37 AM  GAD 7 : Generalized Anxiety Score  Nervous, Anxious, on Edge 1  Control/stop worrying 0  Worry too much - different things 1  Trouble relaxing 1  Restless 0  Easily annoyed or irritable 1  Afraid - awful might happen 1  Total GAD 7 Score 5  Anxiety Difficulty Not difficult at all     Review of Systems:   Pertinent items are noted in HPI Denies any headaches, blurred vision, fatigue, shortness of breath, chest pain, abdominal pain, abnormal vaginal discharge/itching/odor/irritation, problems with periods, bowel movements, urination, or intercourse unless otherwise stated above. Pertinent History  Reviewed:  Reviewed past medical,surgical, social and family history.  Reviewed problem list, medications and allergies. Physical Assessment:   Vitals:   04/04/24 1043  BP: 115/76  Pulse: 87  Weight: 177 lb (80.3 kg)  Height: 5' 5 (1.651 m)  Body mass index is 29.45 kg/m.        Physical Examination:   General appearance - well appearing, and in no distress  Mental status - alert, oriented to person, place, and time  Chest - respiratory effort normal  Heart - normal peripheral perfusion  Breasts - breasts appear normal, no suspicious masses, no skin or nipple changes or axillary nodes  Abdomen - soft, nontender, nondistended, no masses or organomegaly  Pelvic - VULVA: normal appearing vulva with no masses, tenderness or lesions  VAGINA: normal appearing vagina with normal color and discharge, no lesions +levator tenderness CERVIX: normal appearing cervix without discharge or lesions, no CMT  Thin prep pap is done with HR HPV cotesting  UTERUS: uterus is felt to be normal size, shape, consistency and nontender   ADNEXA: No adnexal masses or tenderness noted.  Chaperone present for exam  No results found for this or any previous visit (from the past 24 hours).  Assessment & Plan:  1) Well-Woman Exam Mammogram: @ 29yo, or sooner if problems Colonoscopy: @ 29yo, or sooner if problems Pap: Collected today Gardasil: Discussed, she is considering GC/CT: Collected HIV/HCV: Collected Flu  shot today -     Cytology - PAP( [place]) -     Cervicovaginal ancillary only( [place]) -     Hepatitis C Antibody -     Hepatitis B Surface AntiGEN -     HIV antibody (with reflex) -     RPR  2) Dyspareunia in female -     Ambulatory referral to Physical Therapy  Labs/procedures today:   Orders Placed This Encounter  Procedures   Hepatitis C Antibody   Hepatitis B Surface AntiGEN   HIV antibody (with reflex)   RPR   Ambulatory referral to Physical Therapy   Meds: No orders  of the defined types were placed in this encounter.  Follow-up: Return in about 1 year (around 04/04/2025) for annual exam or sooner as needed.  Kieth JAYSON Carolin, MD 04/04/2024 11:22 AM

## 2024-04-04 NOTE — Addendum Note (Signed)
 Addended by: Yamil Oelke J on: 04/04/2024 11:35 AM   Modules accepted: Orders

## 2024-04-05 ENCOUNTER — Ambulatory Visit: Payer: Self-pay | Admitting: Obstetrics and Gynecology

## 2024-04-05 LAB — CERVICOVAGINAL ANCILLARY ONLY
Chlamydia: NEGATIVE
Comment: NEGATIVE
Comment: NEGATIVE
Comment: NORMAL
Neisseria Gonorrhea: NEGATIVE
Trichomonas: NEGATIVE

## 2024-04-06 LAB — SYPHILIS: RPR W/REFLEX TO RPR TITER AND TREPONEMAL ANTIBODIES, TRADITIONAL SCREENING AND DIAGNOSIS ALGORITHM: RPR Ser Ql: REACTIVE — AB

## 2024-04-06 LAB — HEPATITIS C ANTIBODY: Hep C Virus Ab: NONREACTIVE

## 2024-04-06 LAB — HIV ANTIBODY (ROUTINE TESTING W REFLEX): HIV Screen 4th Generation wRfx: NONREACTIVE

## 2024-04-06 LAB — RPR, QUANT+TP ABS (REFLEX)
Rapid Plasma Reagin, Quant: 1:4 {titer} — ABNORMAL HIGH
T Pallidum Abs: REACTIVE — AB

## 2024-04-06 LAB — HEPATITIS B SURFACE ANTIGEN: Hepatitis B Surface Ag: NEGATIVE

## 2024-04-13 LAB — CYTOLOGY - PAP: Diagnosis: NEGATIVE

## 2024-05-30 ENCOUNTER — Ambulatory Visit

## 2024-07-18 ENCOUNTER — Ambulatory Visit
